# Patient Record
Sex: Female | Born: 1950 | Race: White | Hispanic: No | Marital: Married | Smoking: Never smoker
Health system: Southern US, Community
[De-identification: ages and names within clinical notes are randomized; demographics above are authoritative.]

## PROBLEM LIST (undated history)

## (undated) DIAGNOSIS — I10 Essential (primary) hypertension: Secondary | ICD-10-CM

## (undated) HISTORY — PX: BREAST SURGERY: SHX581

## (undated) HISTORY — PX: ABDOMINAL HYSTERECTOMY: SHX81

## (undated) HISTORY — PX: CSF SHUNT: SHX92

---

## 2014-02-11 NOTE — ED Notes (Signed)
Formatting of this note might be different from the original.  Patient removed from backboard at this time with provider at bedside.  Patient tolerated procedure well.  Electronically signed by Berna BueWright, Joseph R, RN at 02/11/2014  1:58 PM CST

## 2014-02-11 NOTE — ED Provider Notes (Signed)
Formatting of this note is different from the original.  Provider contact with the patient: 02/11/2014    15:42    Heather Waters [id number]    [facility]    History     Chief Complaint   Patient presents with   ? Fall     [facility], no [facility], no blood thinners. 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 C/o neck, back, L elbow, and flank pain. Hx L sided breast cancer.     Chief complaint narrative was entered by triage nurse, not by physician.    HPI Comments: 3:42 PM Heather Waters, a 63 y.o. female with a past medical history that includes--Negative on file--p[facility] no [facility]. Pt states she was not able to get up after the fall. She is now complaining of neck pain, head pain and pain on her left side (elbow, hips, flank).   Pt is not on blood thinners.     PCP: No primary care provider on file.     Fall  The history is provided by the patient. She came to the ER via EMS. Treatment on scene includes c-collar. The accident occurred 1 to 2 hours ago. The fall occurred walking.She fell from a height of 1 - 2 ft. She landed on a hard floor. The point of impact was the left side. The injury/pain location is left hip, left elbow, neck and head.The pain is at a severity of 9/10. The pain is severe. There was no loss of consciousness. She was not ambulatory at the scene. There was no entrapment after the fall. There was no drug use involved in the accident. There was no alcohol use involved in the accident. There has been headaches. There has been no fever, no nausea, no vomiting, no loss of consciousness and no shortness of breath.The symptoms are aggravated by movement and pressure on injury. She has tried nothing for the symptoms.     No past medical history on file.    No past surgical history on file.    No family history on file.    History     Social History   ? Marital  Status: Unknown     Spouse Name: N/A     Number of Children: N/A   ? Years of Education: N/A     Occupational History   ? Not on file.     Social History Main Topics   ? Smoking status: Not on file   ? Smokeless tobacco: Not on file   ? Alcohol Use: Not on file   ? Drug Use: Not on file   ? Sexual Activity: Not on file     Other Topics Concern   ? Not on file     Social History Narrative   ? No narrative on file     Review of Systems     Review of Systems   Constitutional: Negative.  Negative for fever, chills, weight loss, malaise/fatigue and diaphoresis.   HENT: Negative for sore throat.    Respiratory: Negative.  Negative for cough, hemoptysis, sputum production, shortness of breath and wheezing.    Cardiovascular: Negative.  Negative for chest pain, palpitations and leg swelling.   Gastrointestinal: Negative.  Negative for nausea, vomiting and diarrhea.   Genitourinary: Negative.    Musculoskeletal: Positive for joint pain (Left hip and elbow) and neck  pain.   Skin: Negative.  Negative for rash.   Neurological: Positive for headaches. Negative for dizziness, loss of consciousness and weakness.   Endo/Heme/Allergies: Does not bruise/bleed easily.   All other systems reviewed and are negative.    Physical Exam     BP 131/87 mmHg  Pulse 89  Temp(Src) 98.4 F  Resp 14  Ht 1.626 m (5\' 4" )  Wt 54.885 kg (121 lb)  BMI 20.76 kg/m2  SpO2 97%    Physical Exam   Constitutional: She is oriented to person, place, and time. She appears well-developed and well-nourished. Cervical collar in place.   HENT:   Head: Normocephalic and atraumatic.   Right Ear: External ear normal.   Left Ear: External ear normal.   Nose: Nose normal.   Eyes: Conjunctivae and EOM are normal. Pupils are equal, round, and reactive to light.   Neck: Normal range of motion.   Cardiovascular: Normal rate, regular rhythm, normal heart sounds and intact distal pulses.    Pulmonary/Chest: Effort normal and breath sounds normal. No respiratory  distress.   Abdominal: Soft. Bowel sounds are normal.   Musculoskeletal:        Left elbow: Tenderness found.        Cervical back: She exhibits tenderness (Midline).   Neurological: She is alert and oriented to person, place, and time.   Skin: Skin is warm and dry.   Psychiatric: She has a normal mood and affect. Her behavior is normal.   Nursing note and vitals reviewed.    Medications  Current Outpatient Prescriptions   Medication Sig Dispense Refill   ? oxyCODONE-acetaminophen (PERCOCET) 5-325 MG tablet Take 1 Tab by mouth every 6 hours as needed for Pain 30 Tab 0     Procedures   Procedures    ECG Interpretation  ECG Interpretation    Lab Interpretation            Oxygen Saturation Interpretation  The oxygen saturation level is: 94%. The patient was on Room Air for the saturation measurement. Measurement frequency: Spot Check. Oxygen saturation interpretation is Normal. Intervention(s) used: None.    No results found for this visit on 02/11/14.    XR ELBOW 2 VW LEFT   Final Result       Moderate joint effusion with an irregularity of the midportion of the   capitellum suggestive of an osteochondral fracture.     Mild scattered degenerative changes.     No other suspected recent fracture or other abnormality of the left   elbow identified.       CT CERVICAL SPINE NON  CONTRAST   Final Result       Portions of a ventriculoperitoneal shunt tubing in the right side of   the neck.     Suspected operative changes involving the posterior midline of the   foramen magnum and posterior ring of the C1 vertebra though spina   bifida occulta of the posterior midline of C1 may be a consideration.     Mild-to-moderate degenerative changes, most notably at C5-C6 and C6-C7,   with possible muscular spasm.     No other suspected recent fracture or other abnormality identified.         Progress Notes   Initial Plan: CT Cervical Spine Non Contrast, XR Elbow 2VW Left, Norco 5-325mg  tablet    5:50 PM Rechecked pt - Pt is resting.  Informed pt of results from X-Rays and plan to discharge home with a prescription for Percocet  5-325mg . Pt understands and agrees with the plan. All questions addressed.     Pt is medically stable for d/c home at this time.    I have given the patient instructions regarding her diagnosis, expectations, follow up, and return precautions. I explained to the patient that emergent conditions may arise and to return to the ER for new, worsening, or any persistent conditions. I've explained the importance of following up with Mitzi Hansen as instructed. The patient verbalized understanding of the discharge instructions.    ED Course   Medical Decision Making  I have reviewed the: Previous Chart, Nursing Notes and Vitals.  I have interpreted the following results: X-Ray, CT Scans and Oxygen Saturation.    Orders Placed This Encounter   ? CT CERVICAL SPINE NON  CONTRAST   ? XR ELBOW 2 VW LEFT   ? hydrocodone-acetaminophen (NORCO) 5-325 MG tablet 2 Tab   ? oxyCODONE-acetaminophen (PERCOCET) 5-325 MG tablet     Diagnosis:   Encounter Diagnoses   Name Primary?   ? Fall from slipping on slippery surface, initial encounter    ? Elbow fracture, left, closed, initial encounter Yes     New Medications:  Discharge Medication List as of 02/11/2014  5:50 PM     START taking these medications    Details   oxyCODONE-acetaminophen (PERCOCET) 5-325 MG tablet Disp-30 Tab, R-0, Take 1 Tab by mouth every 6 hours as needed for Pain, Print         I have advised the patient to follow-up with:  Phillip Heal., MD  [address]  [place] [address]  [phone]    Call in 1 day  to make appt with Orthopedic Surgeon for definitive care of the fracture    Disposition: Discharged    I have reviewed the information recorded by the scribe and agree with its accuracy and contents--Dr. Blane Ohara   02/11/2014 8:29 PM    Transcribed by Armen Pickup acting scribe on behalf of Dr. Blane Ohara   02/11/2014 5:50 PM  Electronically signed by Louanne Skye, MD at 02/11/2014  8:29 PM CST

## 2014-02-11 NOTE — ED Notes (Signed)
Formatting of this note might be different from the original.  Patient's CT scan resulted and C collar removed per Dr. Blane OharaMeara.  Patient has not had any decrease in left elbow pain after PO administration.  Provider notified and will continue to monitor.    Electronically signed by Berna BueWright, Joseph R, RN at 02/11/2014  4:58 PM CST

## 2014-02-11 NOTE — ED Notes (Signed)
Formatting of this note might be different from the original.  Awaiting provider to see patient. No specific needs from patient at this time.  Awaiting orders.  VSS.  Electronically signed by Berna BueWright, Joseph R, RN at 02/11/2014  3:15 PM CST

## 2014-02-11 NOTE — ED Notes (Signed)
Formatting of this note might be different from the original.  Bed: 23  Expected date:   Expected time:   Means of arrival:   Comments:  EIUTyrell Antonio- EARLENE [address]  Electronically signed by Bryson Damesrakas, Samantha L, RN at 02/11/2014  1:38 PM CST

## 2017-03-07 ENCOUNTER — Emergency Department (INDEPENDENT_AMBULATORY_CARE_PROVIDER_SITE_OTHER): Payer: Medicare Other

## 2017-03-07 ENCOUNTER — Emergency Department (HOSPITAL_BASED_OUTPATIENT_CLINIC_OR_DEPARTMENT_OTHER)
Admission: EM | Admit: 2017-03-07 | Discharge: 2017-03-07 | Disposition: A | Discharge status: Home / Self Care | Payer: Medicare Other | Attending: Emergency Medicine | Admitting: Emergency Medicine

## 2017-03-07 ENCOUNTER — Encounter (HOSPITAL_BASED_OUTPATIENT_CLINIC_OR_DEPARTMENT_OTHER): Payer: Self-pay | Admitting: Emergency Medicine

## 2017-03-07 ENCOUNTER — Emergency Department (HOSPITAL_BASED_OUTPATIENT_CLINIC_OR_DEPARTMENT_OTHER): Payer: Medicare Other

## 2017-03-07 ENCOUNTER — Other Ambulatory Visit: Payer: Self-pay

## 2017-03-07 ENCOUNTER — Emergency Department
Admission: EM | Admit: 2017-03-07 | Discharge: 2017-03-07 | Disposition: A | Discharge status: Home / Self Care | Payer: Medicare Other | Source: Home / Self Care | Attending: Family Medicine | Admitting: Family Medicine

## 2017-03-07 DIAGNOSIS — R112 Nausea with vomiting, unspecified: Secondary | ICD-10-CM | POA: Insufficient documentation

## 2017-03-07 DIAGNOSIS — M549 Dorsalgia, unspecified: Secondary | ICD-10-CM | POA: Diagnosis not present

## 2017-03-07 DIAGNOSIS — R0789 Other chest pain: Secondary | ICD-10-CM

## 2017-03-07 DIAGNOSIS — R1011 Right upper quadrant pain: Secondary | ICD-10-CM | POA: Insufficient documentation

## 2017-03-07 DIAGNOSIS — Z79899 Other long term (current) drug therapy: Secondary | ICD-10-CM | POA: Insufficient documentation

## 2017-03-07 DIAGNOSIS — R079 Chest pain, unspecified: Secondary | ICD-10-CM | POA: Diagnosis not present

## 2017-03-07 DIAGNOSIS — R197 Diarrhea, unspecified: Secondary | ICD-10-CM | POA: Insufficient documentation

## 2017-03-07 HISTORY — DX: Essential (primary) hypertension: I10

## 2017-03-07 LAB — CBC
HEMATOCRIT: 42.4 % (ref 36.0–46.0)
Hemoglobin: 14.1 g/dL (ref 12.0–15.0)
MCH: 30.3 pg (ref 26.0–34.0)
MCHC: 33.3 g/dL (ref 30.0–36.0)
MCV: 91.2 fL (ref 78.0–100.0)
PLATELETS: 236 10*3/uL (ref 150–400)
RBC: 4.65 MIL/uL (ref 3.87–5.11)
RDW: 12.7 % (ref 11.5–15.5)
WBC: 12 10*3/uL — AB (ref 4.0–10.5)

## 2017-03-07 LAB — POCT URINALYSIS DIP (MANUAL ENTRY)
GLUCOSE UA: NEGATIVE mg/dL
Ketones, POC UA: NEGATIVE mg/dL
Leukocytes, UA: NEGATIVE
NITRITE UA: NEGATIVE
PH UA: 5.5 (ref 5.0–8.0)
Protein Ur, POC: NEGATIVE mg/dL
RBC UA: NEGATIVE
UROBILINOGEN UA: 0.2 U/dL

## 2017-03-07 LAB — COMPREHENSIVE METABOLIC PANEL
ALT: 91 U/L — ABNORMAL HIGH (ref 14–54)
ANION GAP: 8 (ref 5–15)
AST: 181 U/L — AB (ref 15–41)
Albumin: 4.2 g/dL (ref 3.5–5.0)
Alkaline Phosphatase: 115 U/L (ref 38–126)
BILIRUBIN TOTAL: 1.4 mg/dL — AB (ref 0.3–1.2)
BUN: 12 mg/dL (ref 6–20)
CHLORIDE: 103 mmol/L (ref 101–111)
CO2: 28 mmol/L (ref 22–32)
Calcium: 9.4 mg/dL (ref 8.9–10.3)
Creatinine, Ser: 0.74 mg/dL (ref 0.44–1.00)
GFR calc Af Amer: >60 mL/min (ref >60)
Glucose, Bld: 118 mg/dL — ABNORMAL HIGH (ref 65–99)
POTASSIUM: 4.1 mmol/L (ref 3.5–5.1)
Sodium: 139 mmol/L (ref 135–145)
TOTAL PROTEIN: 7.9 g/dL (ref 6.5–8.1)

## 2017-03-07 LAB — D-DIMER, QUANTITATIVE (NOT AT ARMC): D DIMER QUANT: 0.56 ug{FEU}/mL — AB (ref 0.00–0.50)

## 2017-03-07 LAB — LIPASE, BLOOD: LIPASE: 26 U/L (ref 11–51)

## 2017-03-07 NOTE — ED Triage Notes (Signed)
Patient started to have pain in her right back and is coming around to the front. The patient states that she is having pain to her right upper quad region. The patient is also having N/V

## 2017-03-07 NOTE — Discharge Instructions (Signed)
Call if rash develops °

## 2017-03-07 NOTE — ED Notes (Signed)
Back to room.

## 2017-03-07 NOTE — ED Provider Notes (Signed)
[facility]    CSN: [id number] Arrival date & time: 03/07/17  1257     History   Chief Complaint Chief Complaint  Patient presents with  . Back Pain    HPI Sharon Carter is a 67 y.o. female.   Patient was awakened at 2:30am today by sharp pain in her right posterior chest that only lasted about 3 to 4 minutes.  The pain recurred about one hour ago, and lasted 2 to 3 minutes.  No recent cold or URI symptoms.  The pain Is not pleuritic.  She feels well otherwise.  No nausea/vomiting.  No urinary symptoms.  No fevers, chills, and sweats.  No rash. She has a past history of kidney stones, but notes that her present pain is different.  No changes in her urine.   The history is provided by the patient.    Past Medical History:  Diagnosis Date  . BP (high blood pressure)    in the past    There are no active problems to display for this patient.   Past Surgical History:  Procedure Laterality Date  . ABDOMINAL HYSTERECTOMY    . BREAST SURGERY     breast cancer  . CSF SHUNT     1962, 1986, 2016    OB History    No data available       Home Medications    Prior to Admission medications   Medication Sig Start Date End Date Taking? Authorizing Provider  amLODipine (NORVASC) 10 MG tablet Take 10 mg by mouth daily.   Yes [provider]  clonazePAM (KLONOPIN) 0.5 MG tablet Take 0.5 mg by mouth daily.   Yes [provider]  DULoxetine (CYMBALTA) 20 MG capsule Take 20 mg by mouth daily.   Yes [provider]  gabapentin (NEURONTIN) 100 MG capsule Take 100 mg by mouth 4 (four) times daily.   Yes [provider]  traMADol (ULTRAM) 50 MG tablet Take by mouth every 4 (four) hours as needed (up to 8 per day).   Yes [provider]    Family History Family History  Problem Relation Age of Onset  . Cancer Mother   . Diabetes Father   . Diabetes Brother     Social History Social History   Tobacco Use  . Smoking  status: Never Smoker  . Smokeless tobacco: Never Used  Substance Use Topics  . Alcohol use: Yes  . Drug use: No     Allergies   Patient has no known allergies.   Review of Systems Review of Systems  Constitutional: Negative for activity change, appetite change, chills, diaphoresis, fatigue and fever.  HENT: Negative.   Eyes: Negative.   Respiratory: Positive for chest tightness. Negative for cough, shortness of breath, wheezing and stridor.   Cardiovascular: Negative for palpitations and leg swelling.  Gastrointestinal: Negative.   Genitourinary: Negative.   Musculoskeletal: Negative.   Skin: Negative for rash.  Neurological: Positive for dizziness.     Physical Exam Triage Vital Signs ED Triage Vitals  Enc Vitals Group     BP 03/07/17 1326 123/83     Pulse Rate 03/07/17 1326 90     Resp --      Temp 03/07/17 1326 98 F (36.7 C)     Temp Source 03/07/17 1326 Oral     SpO2 03/07/17 1326 100 %     Weight 03/07/17 1327 101 lb (45.8 kg)     Height 03/07/17 1327 5\' 3"  (1.6 m)  Head Circumference --      Peak Flow --      Pain Score 03/07/17 1327 3     Pain Loc --      Pain Edu? --      Excl. in GC? --    No data found.  Updated Vital Signs BP 123/83 (BP Location: Right Arm)   Pulse 90   Temp 98 F (36.7 C) (Oral)   Ht 5\' 3"  (1.6 m)   Wt 101 lb (45.8 kg)   SpO2 100%   BMI 17.89 kg/m   Visual Acuity Right Eye Distance:   Left Eye Distance:   Bilateral Distance:    Right Eye Near:   Left Eye Near:    Bilateral Near:     Physical Exam  Constitutional: She appears well-developed and well-nourished. No distress.  HENT:  Head: Normocephalic.  Right Ear: External ear normal.  Left Ear: External ear normal.  Nose: Nose normal.  Mouth/Throat: Oropharynx is clear and moist.  Eyes: Conjunctivae are normal. Pupils are equal, round, and reactive to light.  Neck: Neck supple.  Cardiovascular: Normal heart sounds.  Pulmonary/Chest: Breath sounds normal.        Right posterior chest has vague tenderness to palpation  as noted on diagram.  No rash noted.  No flank or CVA tenderness.  Abdominal: Soft. Bowel sounds are normal. She exhibits no distension.  Lymphadenopathy:    She has no cervical adenopathy.  Neurological: She is alert.  Skin: Skin is warm. No rash noted.  Nursing note and vitals reviewed.    UC Treatments / Results  Labs (all labs ordered are listed, but only abnormal results are displayed) Labs Reviewed  POCT URINALYSIS DIP (MANUAL ENTRY) - Abnormal; Notable for the following components:      Result Value   Bilirubin, UA small (*)    Spec Grav, UA >=1.030 (*)    All other components within normal limits    EKG  EKG Interpretation None       Radiology Dg Chest 2 View  Result Date: 03/07/2017 CLINICAL DATA:  Right posterior chest pain. EXAM: CHEST  2 VIEW COMPARISON:  None. FINDINGS: Heart size and pulmonary vascularity are normal and the lungs are clear except for slight irregular apical pleural thickening on the left. No effusions. 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 Surgical clips in the left axilla. No acute bone abnormality. Aortic atherosclerosis. Benign circumscribed calcification in the upper left breast. IMPRESSION: No active cardiopulmonary disease. Electronically Signed   By: Francene Boyers M.D.   On: 03/07/2017 14:00    Procedures Procedures (including critical care time)  Medications Ordered in UC Medications - No data to display   Initial Impression / Assessment and Plan / UC Course  I have reviewed the triage vital signs and the nursing notes.  Pertinent labs & imaging results that were available during my care of the patient were reviewed by me and considered in my medical decision making (see chart for details).    Negative chest X-ray and urinalysis reassuring. No obvious cause of patient's symptoms. ?early shingles. Call if rash develops (would start  Valtrex).    Final Clinical Impressions(s) / UC Diagnoses   Final diagnoses:  Other chest pain    ED Discharge Orders    None           Lattie Haw, MD 03/07/17 1514

## 2017-03-07 NOTE — ED Provider Notes (Signed)
I have personally seen and examined the patient. I have reviewed the documentation on PMH/FH/Soc Hx. I have discussed the plan of care with the resident and patient.  I have reviewed and agree with the resident's documentation. Please see associated encounter note.  Briefly, the patient is a 67 y.o. female here with right mid back pain with radiation to the RUQ. Exam with TTP to right paraspinal muscle with spasms, mild RUQ pain w/o Murphy's sign. Labs with mild transaminitis. RUQ US with cholelithiasis but no evidence of acute cholecystitis or dilated common bile duct.  Pain at peers to be mostly muscular in nature.  Given her history of breast cancer, PE was also considered and dimer obtained which was negative when age-adjusted.  Chest x-ray and urinalysis obtained in urgent care were unremarkable.  The patient appears reasonably screened and/or stabilized for discharge and I doubt any other medical condition or other [facility] requiring further screening, evaluation, or treatment in the ED at this time prior to discharge.  The patient is safe for discharge with strict return precautions.    EKG Interpretation  Date/Time:  Monday March 07 2017 17:17:53 EST Ventricular Rate:  85 PR Interval:  138 QRS Duration: 78 QT Interval:  384 QTC Calculation: 456 R Axis:   -28 Text Interpretation:  Normal sinus rhythm Normal ECG Confirmed by Drema Pryardama, Farida Mcreynolds [id number]([id number]) on 03/07/2017 6:34:52 PM         Belenda Alviar, Amadeo GarnetPedro Eduardo, MD 03/08/17 0031

## 2017-03-07 NOTE — ED Triage Notes (Signed)
Pt started having upper back pain around 2:30 last night.  It has eased since, denies urinary sx.

## 2017-03-07 NOTE — ED Provider Notes (Signed)
[facility] EMERGENCY DEPARTMENT Provider Note   CSN: [id number] Arrival date & time: 03/07/17  1710   History   Chief Complaint Chief Complaint  Patient presents with  . Back Pain    HPI Sharon Carter is a 67 y.o. female with history of breast cancer in 5 years remission, also history of ventriculoperitoneal CSF shunt for hydrocephalus presenting with right upper back pain that has been intermittent starting at 2:30 AM.  Sharp pain that radiates from back to front. No dyspnea, not worse with deep breaths. She does endorse nausea, vomiting x1 in the ED, and diarrhea with watery stools x4-5 days. No fevers. No history of cholecystitis, no abdominal surgeries. She was seen earlier today in urgent care for similar symptoms.  Past Medical History:  Diagnosis Date  . BP (high blood pressure)    in the past    There are no active problems to display for this patient.   Past Surgical History:  Procedure Laterality Date  . ABDOMINAL HYSTERECTOMY    . BREAST SURGERY     breast cancer  . CSF SHUNT     1962, 1986, 2016    OB History    No data available      Home Medications    Prior to Admission medications   Medication Sig Start Date End Date Taking? Authorizing Provider  amLODipine (NORVASC) 10 MG tablet Take 10 mg by mouth daily.    [provider]  clonazePAM (KLONOPIN) 0.5 MG tablet Take 0.5 mg by mouth daily.    [provider]  DULoxetine (CYMBALTA) 20 MG capsule Take 20 mg by mouth daily.    [provider]  gabapentin (NEURONTIN) 100 MG capsule Take 100 mg by mouth 4 (four) times daily.    [provider]  traMADol (ULTRAM) 50 MG tablet Take by mouth every 4 (four) hours as needed (up to 8 per day).    [provider]   Family History Family History  Problem Relation Age of Onset  . Cancer Mother   . Diabetes Father   . Diabetes Brother     Social History Social History   Tobacco Use  . Smoking status:  Never Smoker  . Smokeless tobacco: Never Used  Substance Use Topics  . Alcohol use: Yes  . Drug use: No     Allergies   Patient has no known allergies.  Review of Systems Review of Systems  Constitutional: Negative for fever.  HENT: Negative for congestion, rhinorrhea and sore throat.   Respiratory: Negative for cough, chest tightness, shortness of breath and wheezing.   Cardiovascular: Negative for chest pain.  Gastrointestinal: Negative for abdominal distention.  Skin: Negative for rash.    Physical Exam Updated Vital Signs BP 112/76 (BP Location: Right Arm)   Pulse 82   Temp (!) 97.5 F (36.4 C) (Oral)   Resp 18   Ht 5\' 3"  (1.6 m)   Wt 45.8 kg (101 lb)   SpO2 98%   BMI 17.89 kg/m   Physical Exam  Constitutional: She is oriented to person, place, and time. She appears well-developed and well-nourished.  Eyes: Conjunctivae are normal.  Neck: Neck supple.  Cardiovascular: Normal rate, regular rhythm and normal heart sounds. Exam reveals no friction rub.  No murmur heard. Pulmonary/Chest: Effort normal and breath sounds normal. No respiratory distress. She has no wheezes. She has no rales.  Abdominal: Soft.  Tenderness to palpation in RUQ without rebound or guarding.  Genitourinary:  Genitourinary Comments:  No CVA tenderness  Musculoskeletal:  +tenderness to palpation over right upper back, no midline tenderness or ttp over spinous process.   Neurological: She is alert and oriented to person, place, and time.  Skin: Skin is warm and dry. No rash noted.    ED Treatments / Results  Labs (all labs ordered are listed, but only abnormal results are displayed) Labs Reviewed  D-DIMER, QUANTITATIVE (NOT AT [facility]) - Abnormal; Notable for the following components:      Result Value   D-Dimer, Quant 0.56 (*)    All other components within normal limits  CBC - Abnormal; Notable for the following components:   WBC 12.0 (*)    All other components within normal limits    COMPREHENSIVE METABOLIC PANEL - Abnormal; Notable for the following components:   Glucose, Bld 118 (*)    AST 181 (*)    ALT 91 (*)    Total Bilirubin 1.4 (*)    All other components within normal limits  LIPASE, BLOOD    EKG  EKG Interpretation  Date/Time:  Monday March 07 2017 17:17:53 EST Ventricular Rate:  85 PR Interval:  138 QRS Duration: 78 QT Interval:  384 QTC Calculation: 456 R Axis:   -28 Text Interpretation:  Normal sinus rhythm Normal ECG Confirmed by Drema Pry [phone]) on 03/07/2017 6:34:52 PM      Radiology Dg Chest 2 View  Result Date: 03/07/2017 CLINICAL DATA:  Right posterior chest pain. EXAM: CHEST  2 VIEW COMPARISON:  None. FINDINGS: Heart size and pulmonary vascularity are normal and the lungs are clear except for slight irregular apical pleural thickening on the left. No effusions. 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 Surgical clips in the left axilla. No acute bone abnormality. Aortic atherosclerosis. Benign circumscribed calcification in the upper left breast. IMPRESSION: No active cardiopulmonary disease. Electronically Signed   By: Francene Boyers M.D.   On: 03/07/2017 14:00   US Abdomen Limited Ruq  Result Date: 03/07/2017 CLINICAL DATA:  67 year old female with right upper quadrant abdominal pain. EXAM: ULTRASOUND ABDOMEN LIMITED RIGHT UPPER QUADRANT COMPARISON:  None. FINDINGS: Gallbladder: The gallbladder is physiologically distended. Multiple small stones noted in the gallbladder there is no gallbladder wall thickening or pericholecystic fluid. Negative sonographic Murphy's sign. Common bile duct: Diameter: 6 mm Liver: The liver is grossly unremarkable. Portal vein is patent on color Doppler imaging with normal direction of blood flow towards the liver. IMPRESSION: 1. Cholelithiasis without sonographic evidence of acute cholecystitis. 2. Patent main portal vein with hepatopetal flow. Electronically Signed   By: Elgie Collard M.D.   On: 03/07/2017 21:20    Procedures Procedures (including critical care time)  Medications Ordered in ED Medications - No data to display   Initial Impression / Assessment and Plan / ED Course  I have reviewed the triage vital signs and the nursing notes.  Pertinent labs & imaging results that were available during my care of the patient were reviewed by me and considered in my medical decision making (see chart for details).   66yoF with history of breast cancer 5 years in remission who presents with right upper back pain radiating to her RUQ. Uncertain etiology; considered cholecystitis, however U/S of her GB showed no wall thickening or pericholecystic fluid. Lipase was normal. Considered PE given history of malignancy, however Ddimer was WNL when adjusted for age.  UA and CXR from earlier today at urgent care were also unremarkable.  Patient remained stable and well-appearing while monitored in  the ED. She was considered stable for discharge.   Final Clinical Impressions(s) / ED Diagnoses   Final diagnoses:  RUQ pain  Acute right-sided back pain, unspecified back location   ED Discharge Orders    None       Howard Pouch, MD 03/07/17 2224

## 2017-03-07 NOTE — Discharge Instructions (Signed)
You were seen in the Emergency Department for back pain and abdominal pain. This may be caused by muscle strain. Your ultrasound of your gall bladder was negative.  Please follow up with your regular doctor. If you develop shortness of breath or pain with breathing, these would be reasons to seek care sooner.

## 2017-03-07 NOTE — ED Notes (Signed)
Patient transported to Ultrasound 

## 2017-04-17 ENCOUNTER — Emergency Department (INDEPENDENT_AMBULATORY_CARE_PROVIDER_SITE_OTHER): Payer: Medicare Other

## 2017-04-17 ENCOUNTER — Emergency Department (INDEPENDENT_AMBULATORY_CARE_PROVIDER_SITE_OTHER)
Admission: EM | Admit: 2017-04-17 | Discharge: 2017-04-17 | Disposition: A | Discharge status: Home / Self Care | Payer: Medicare Other | Source: Home / Self Care

## 2017-04-17 ENCOUNTER — Encounter: Payer: Self-pay | Admitting: Emergency Medicine

## 2017-04-17 DIAGNOSIS — M25511 Pain in right shoulder: Secondary | ICD-10-CM | POA: Diagnosis not present

## 2017-04-17 DIAGNOSIS — S46911A Strain of unspecified muscle, fascia and tendon at shoulder and upper arm level, right arm, initial encounter: Secondary | ICD-10-CM

## 2017-04-17 MED ORDER — METHOCARBAMOL 500 MG PO TABS: 500.0000 mg | ORAL_TABLET | Freq: Two times a day (BID) | ORAL | 0 refills | Status: AC

## 2017-04-17 MED ORDER — DICLOFENAC SODIUM 50 MG PO TBEC: 50.0000 mg | DELAYED_RELEASE_TABLET | Freq: Two times a day (BID) | ORAL | 0 refills | Status: AC

## 2017-04-17 NOTE — ED Triage Notes (Signed)
Formatting of this note might be different from the original.  Patient complaining of complaining of right shoulder pain for several weeks, patient fell about 3 weeks ago  Electronically signed by Ivan Croft, CMA at 04/17/2017  2:56 PM EST
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
## 2017-04-17 NOTE — Discharge Instructions (Signed)
Return if any problems.
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
## 2017-04-17 NOTE — ED Triage Notes (Signed)
Patient complaining of complaining of right shoulder pain for several weeks, patient fell about 3 weeks ago

## 2019-08-06 IMAGING — DX DG CHEST 2V
2 series · 2 of 2 positions shown · non-contrast
Comparison: None.

CLINICAL DATA: Right posterior chest pain.

EXAM:
CHEST  2 VIEW

[chest pa]
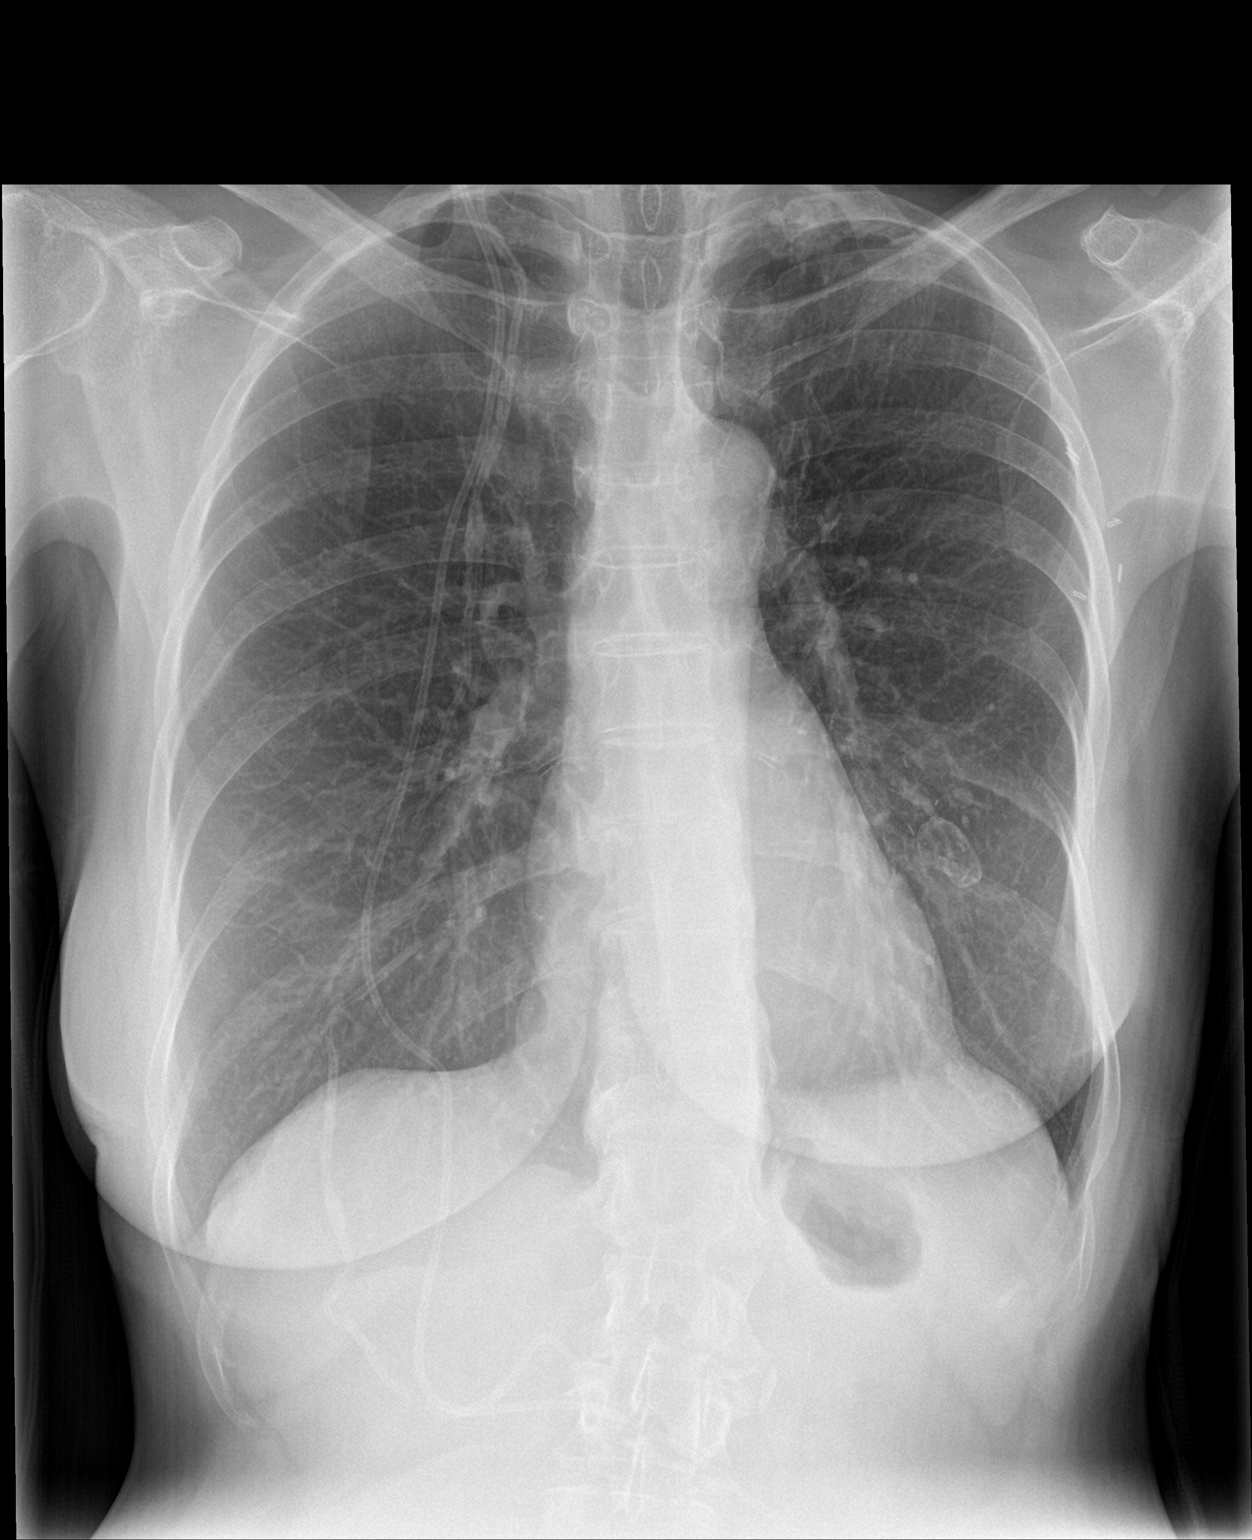

[chest lat]
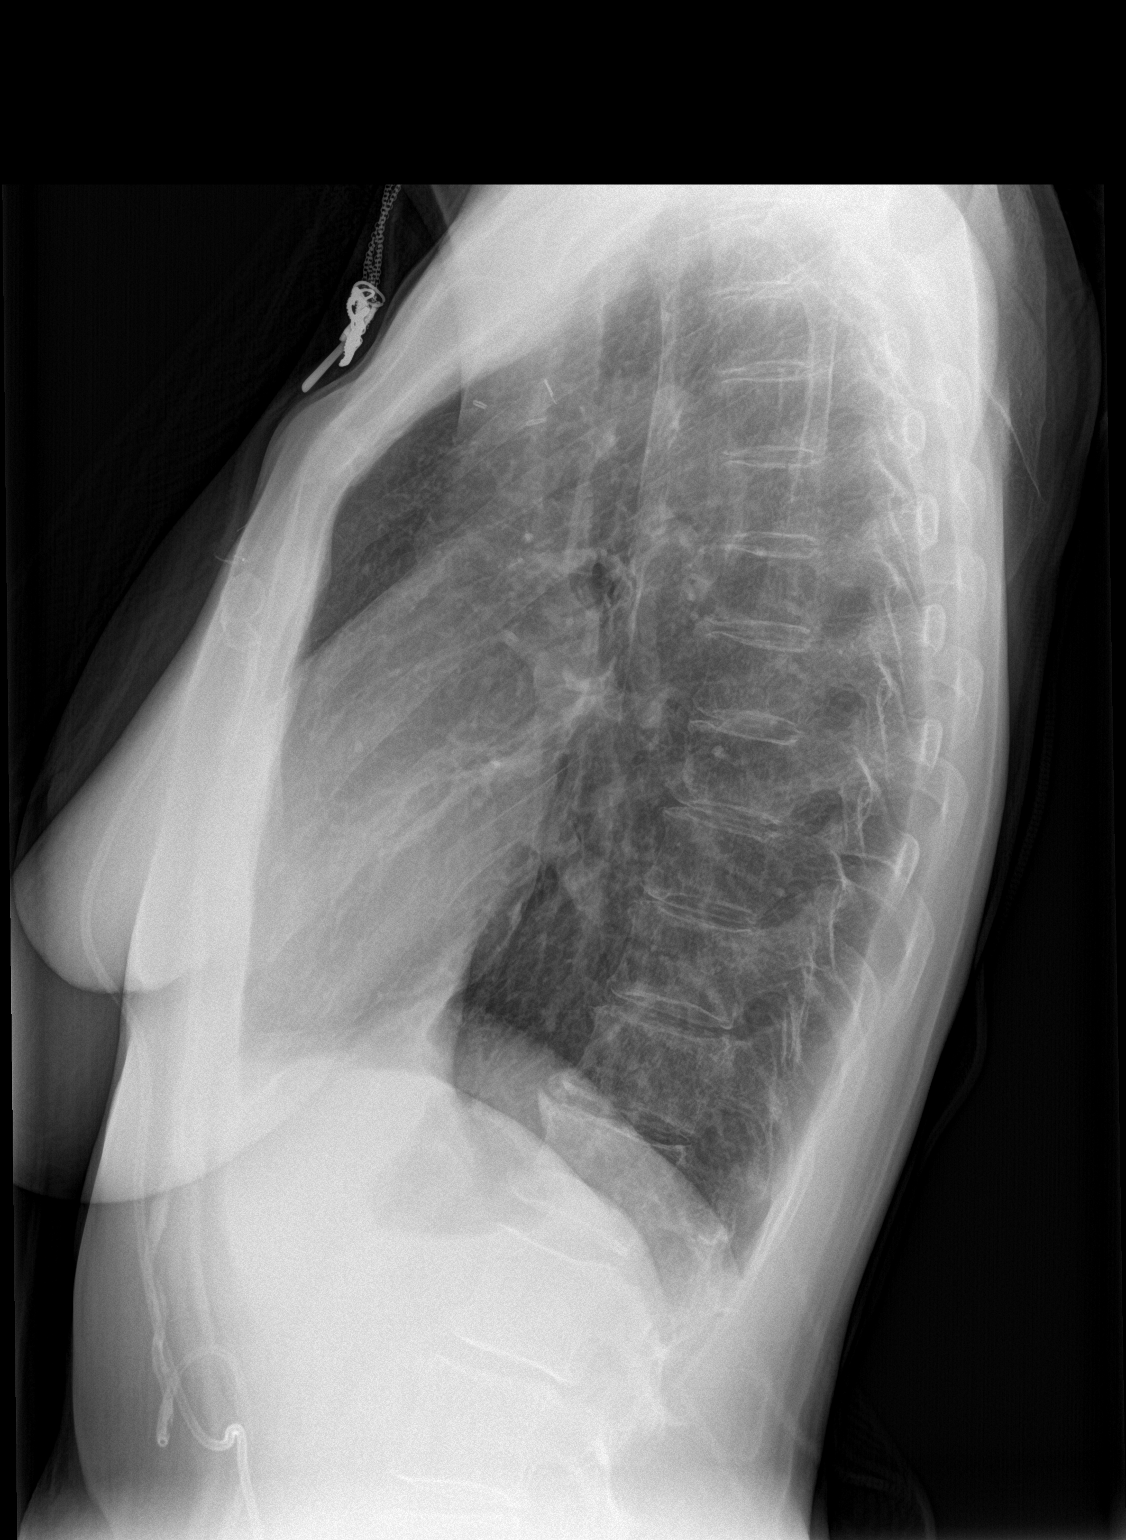

[2 of 2 positions shown; findings below may reference images not displayed]

FINDINGS: Heart size and pulmonary vascularity are normal and the lungs are
clear except for slight irregular apical pleural thickening on the
left. No effusions.

Ventriculoperitoneal shunt tubes overlie the right hemithorax.
Surgical clips in the left axilla. No acute bone abnormality.

Aortic atherosclerosis.

Benign circumscribed calcification in the upper left breast.
IMPRESSION: No active cardiopulmonary disease.

## 2019-09-16 IMAGING — DX DG SHOULDER 2+V*R*
3 series · 3 of 3 positions shown · non-contrast
Comparison: 03/07/2017 chest radiograph

CLINICAL DATA: Acute RIGHT shoulder pain following fall 3 weeks
ago. Initial encounter.

EXAM:
RIGHT SHOULDER - 2+ VIEW

[shoulder grashey]
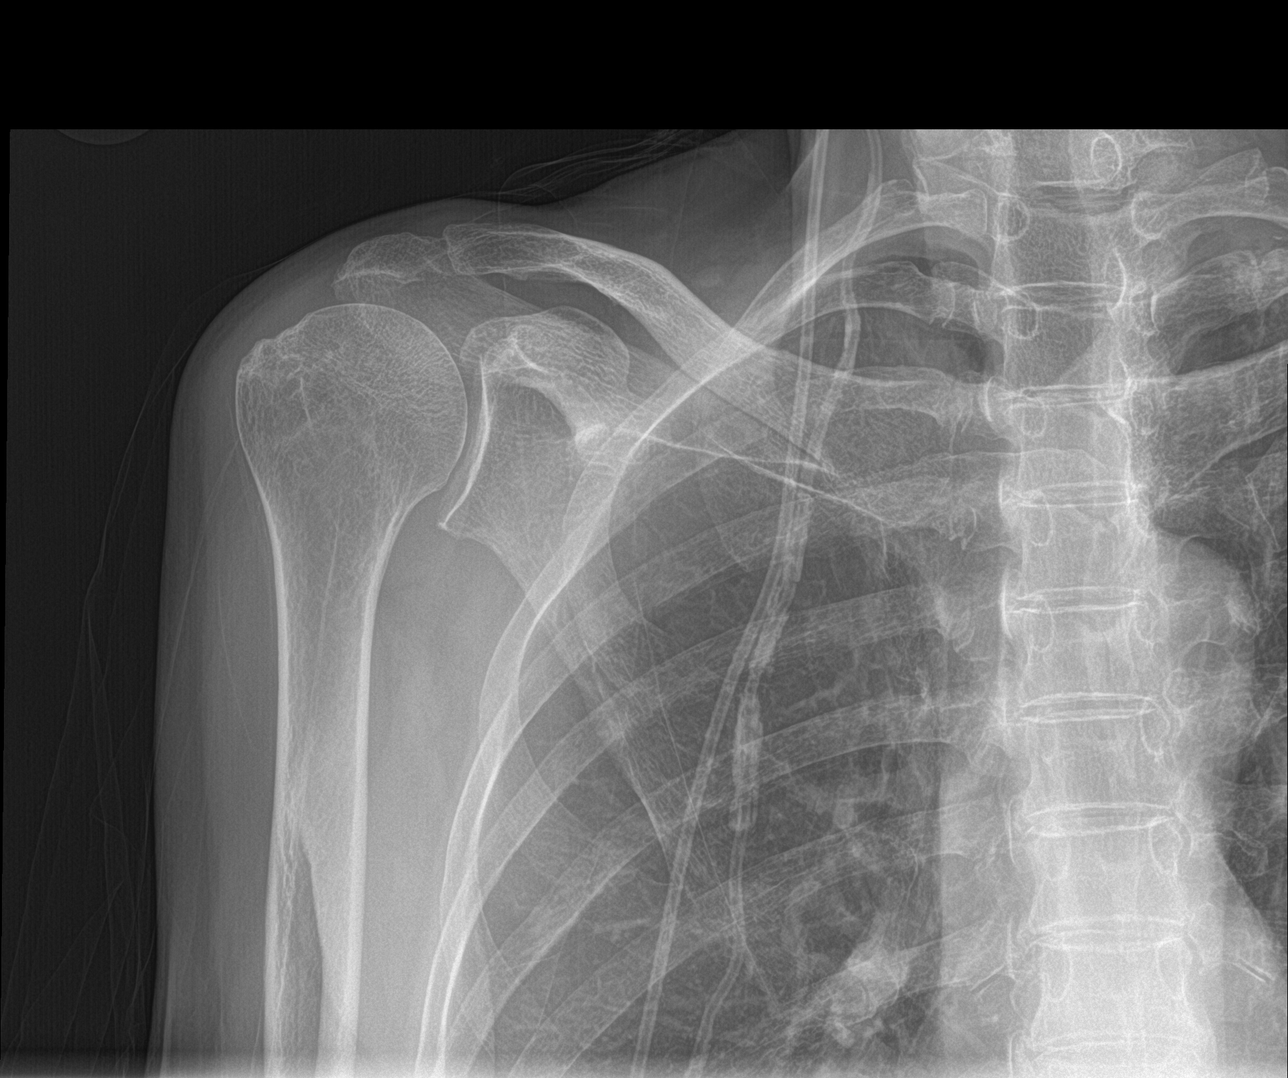

[shoulder y view]
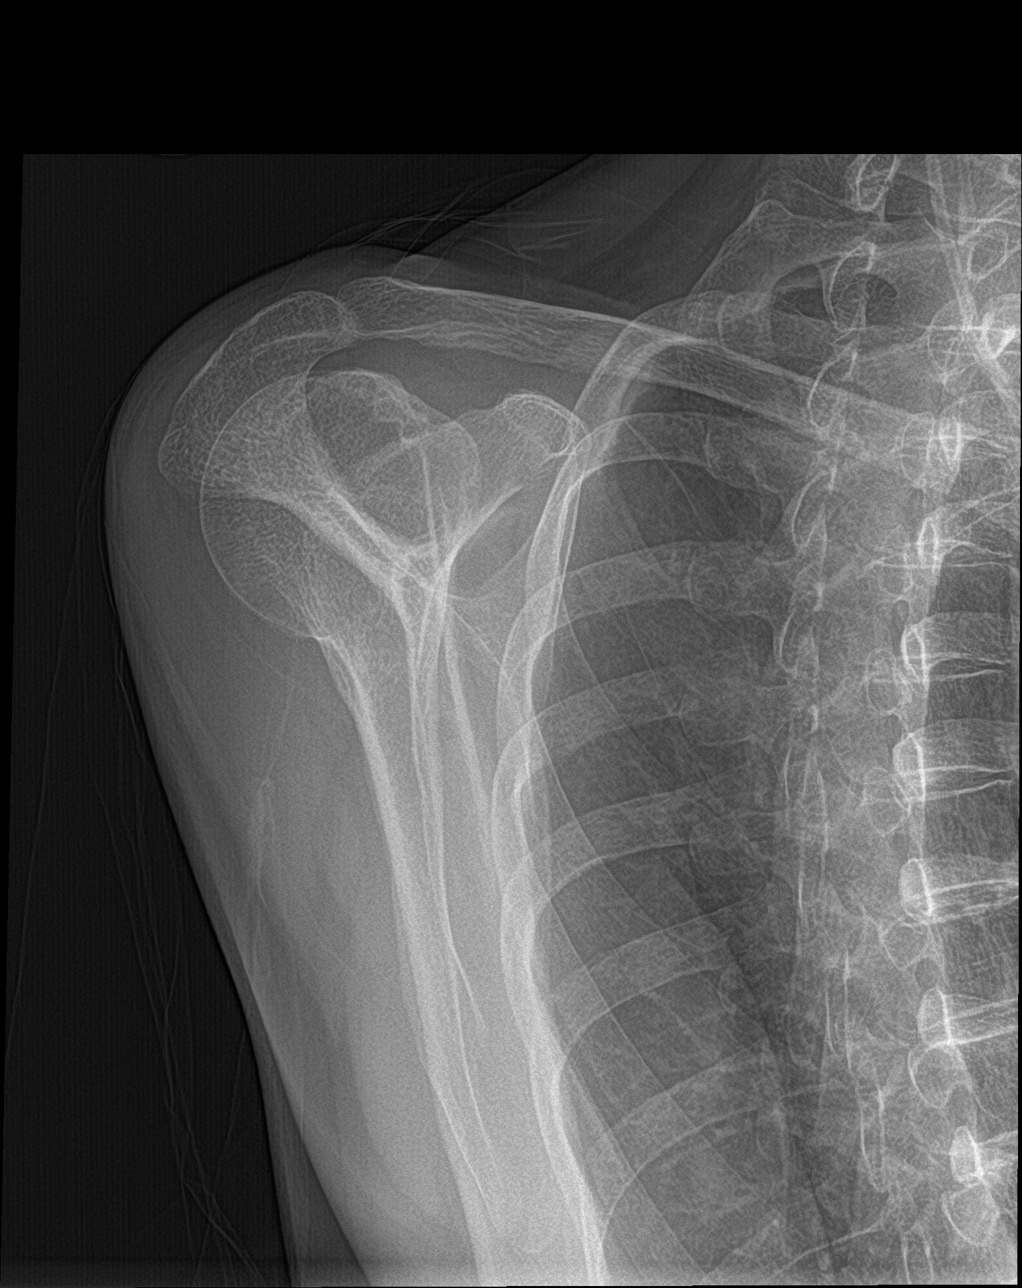

[shoulder axillary]
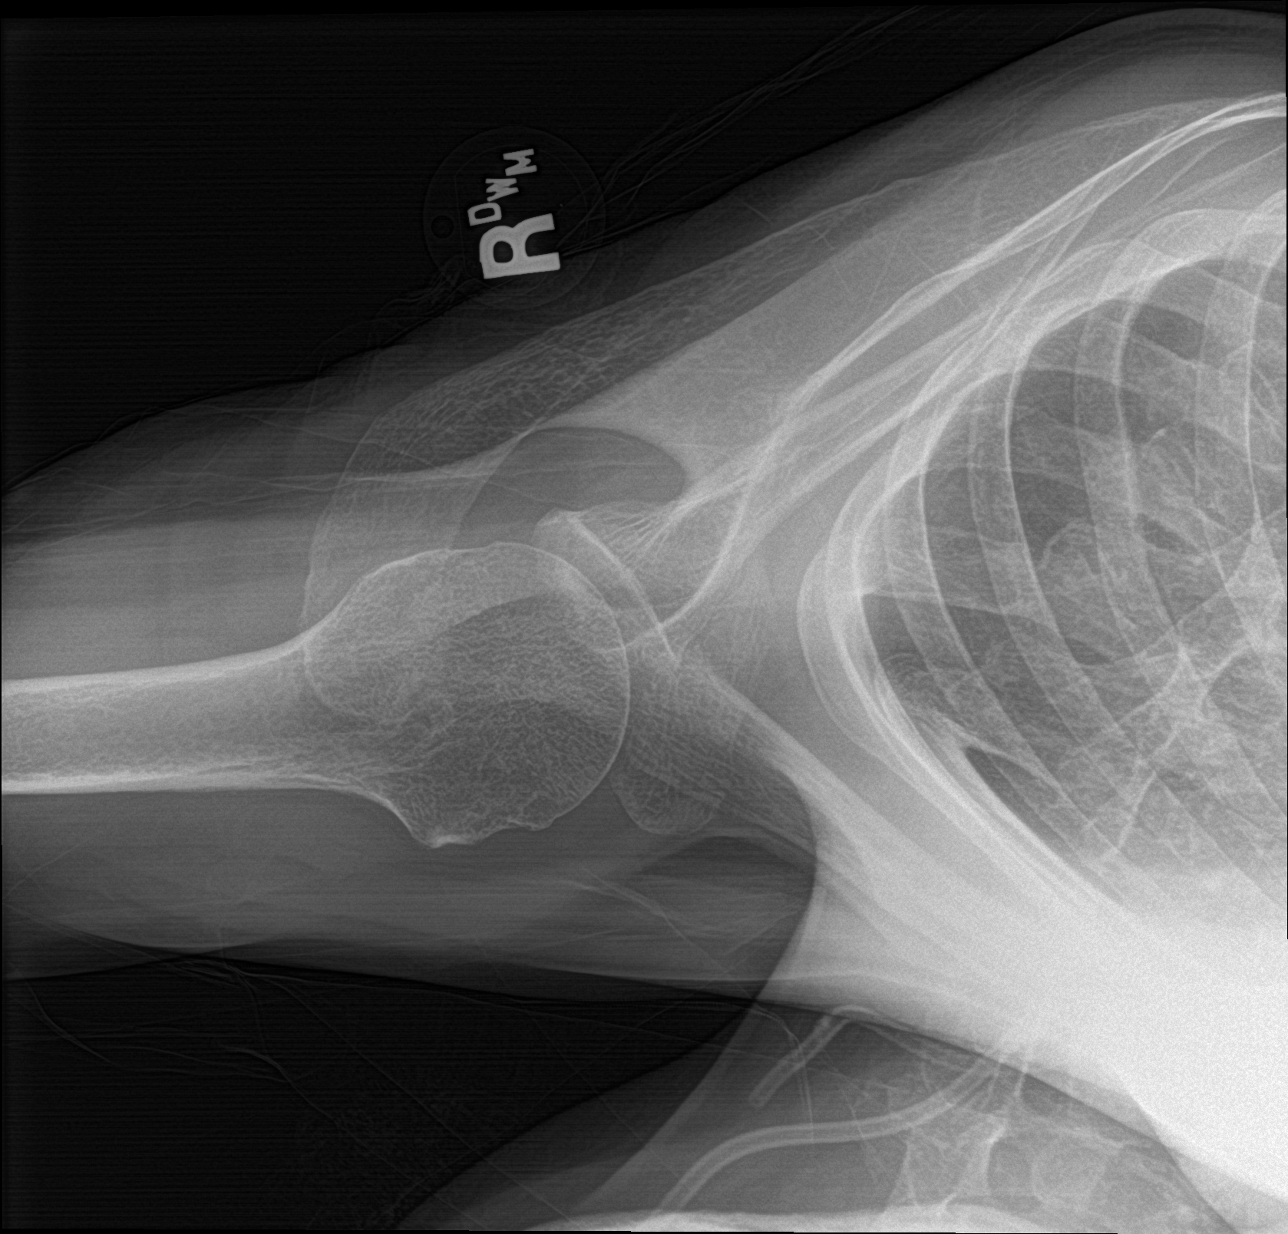

[3 of 3 positions shown; findings below may reference images not displayed]

FINDINGS: There is no evidence of fracture or dislocation. There is no
evidence of arthropathy or other focal bone abnormality. Soft
tissues are unremarkable.
IMPRESSION: Negative.
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
## 2021-10-24 NOTE — ED Provider Notes (Signed)
Associated Order(s): Critical Care  Formatting of this note is different from the original.  MEDICAL DECISION MAKING     Complexity of Primary Problem Addressed:  <<Acute injury that poses a threat to life or bodily function>>    Disposition Decision:  ADMIT TO SURGERY with neurosurgery consultation ([facility])    Problems Addressed:  1. Subdural hemorrhage (CMS/HCC)    2. Fall, initial encounter    3. VP (ventriculoperitoneal) shunt status    4. 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 Leda Gauze, Dr Hinda Kehr    Pertinent ED Course, Orders, and Results Reviewed:  ED Course as of 10/24/21 2009   Sat Oct 24, 2021   1648 Pt reports last Tdap was 3 years ago- she is up to date.   [SS]   [place] of radiology contacted:   "left tentorium and posterior falx SDH" [SS]   6270 Dr Ronita Hipps of neurosurgery paged. [SS]   1739 Trauma 1st call paged, DR Hinda Kehr.  Will see for admission [SS]   1803 CT CERVICAL SPINE WITHOUT CONTRAST [SS]   1803 XR SHOULDER MINIMAL 2 VW RIGHT [SS]   1803 CT HEAD WITHOUT CONTRAST [SS]   1803 XR CHEST 1 VW [SS]   1803 CT HEAD WITHOUT CONTRAST [SS]   1902 Comprehensive metabolic panel(!):    Sodium 143   Potassium 3.9   Chloride 107   CO2 25.5   Anion Gap 11   Osmolality Calc 294.93   BUN 11   Creatinine, Ser 0.66   Bun/Creatinine Ratio 16.67   Glucose, Fasting 90   Calcium 9.3   Corrected  Calcium 9.3   Total Protein 7.1   Albumin 4.0   Globulin, Total 3.10   ALT (SGPT) 6(!)   AST 15   Alkaline Phosphatase 98   Total Bilirubin 0.4   A/G Ratio 1   eGFR 93.9 [SS]   1902 Ethanol:    Ethanol <10 [SS]   1902 Lipase:    Lipase 17 [SS]   1923 CBC auto differential:    White Blood Cell(WBC)Count 9.6   Red Blood Cell (RBC) Count 4.29   Hemoglobin 13.1   HEMATOCRIT 38.9   MCV 90.7   MCH 30.5   MCHC 33.6   RDW 14.0   Platelets 213   Neutrophils Relative 65.8   Lymphocytes Relative 24.3   Monocytes Relative 7.7   Eosinophils Relative 1.4   Basophils Relative 0.9   Neutrophils Absolute 6.3   Lymphocytes Absolute 2.3   Monocytes Absolute 0.7   Eosinophils Absolute 0.1   Baso (Absolute) 0.1   Nucleated RBC Absolute 0.0   nRBC 0.0 [SS]   2009 Thromboelastograph (TEG)(!):    Alpha Angle 73.1(!)   Coagulation Index 3.3(!)   Heparinized Sample No   Kinetics 1.1   Maximum Amplitude 72.4(!)   Percent Lysis At 30 Minutes  0.0   Rate 4.4(!) [SS]   2009 Type and screen:    ABO RH O POS   Antibody Screen NEG   Crossmatch Status NTYPE   Specimen Expiration Date 10/27/21 [SS]   2009 Prothrombin Time-INR(!):    PT 12.3(!)   INR 0.9(!) [SS]     ED Course User Index  [SS] Milana Huntsman, MD     Orders Placed This Encounter   Procedures    Critical Care    CT HEAD WITHOUT CONTRAST    CT CERVICAL SPINE WITHOUT CONTRAST    XR CHEST 1 VW    XR SHOULDER MINIMAL 2 VW RIGHT    CT HEAD WITHOUT CONTRAST    CBC auto differential    Comprehensive metabolic panel    Lipase    Urinalysis with microscopic    Lactic Acid Reflex    Prothrombin Time-INR    PTT (aPTT)    Type and screen    Thromboelastograph (TEG)    Ethanol    Drug screen, urine    Extra Tubes    Extra Tube Gold    ABORH2    NPO Diet:    Log roll    In and out catheter for spec collection    Cardiac monitoring    Category 1 - Full Code    Inpatient consult to Neurosurgery    Inpatient consult to Trauma Surgery    Pulse oximetry, continuous    Insert and maintain  peripheral IV    Admit to Inpatient    ED IP Bed Request     All ED ORDERS INDEPENDENTLY REVIEWED BY DR [place]- SEE ED COURSE ABOVE FOR PERTINENT RESULTS    ED Medications Administered:  Medications   clonazePAM (use for KlonoPIN) tablet 1 mg (has no administration in time range)   gabapentin (use for NEURONTIN) capsule 300 mg (has no administration in time range)   pantoprazole (use for PROTONIX) EC tablet 20 mg (has no administration in time range)   DULoxetine (use for CYMBALTA) DR capsule 60 mg (has no administration in time range)   sodium chloride 0.9 % bolus premix 1,000 mL (0 mL Intravenous Stopped 10/24/21 2008)   levETIRAcetam (use for KEPPRA) injection 1,000 mg (1,000 mg Intravenous Given 10/24/21 1800)   fentaNYL (PF) (use for SUBLIMAZE) injection 50 mcg (50 mcg Intravenous Given 10/24/21 1802)   ondansetron (PF) (use for ZOFRAN) injection 4 mg (4 mg Intravenous Given 10/24/21 1759)     All ED MEDICATIONS ADMINISTERED REQUIRED MONITORING AND RE-EVALUATION FOR TOXICITY, SIDE EFFECTS, AND CHANGE IN VITAL SIGNS    Independent Interpretation of Tests (only checked boxes) (also see ED course above):  _0   EKG:   personally visualized, see my read in ED course above; also official read in [facility]  _1   CXR:   lungs clear  _2   Head CT: Posterior left subdural hemorrhage   _3   CTPE:  _4   CT C-spine:  no acute findings  _5   CT Chest:   _6   CT Abdomen/Pelvis:    _7   Ultrasound:  _8   ABG:    Diagnostic Tests or Therapies Considered But Not Ordered:    Important Co-Morbidities Increasing Patient's Risk:  >  > has no past medical history on file.  >No past surgical history on file.    Social Determinants of Health:  >  >  Social History     Tobacco Use    Smoking status: Not on file    Smokeless tobacco: Not  on file   Substance Use Topics    Alcohol use: Not on file     >family history is not on file.     All external notes if available were reviewed including EMS records, nursing records, most recent ED visit, most  recent hospital admission, transferring hospital records, transferring medical facility records, prior visits in our hospital system, prior visits in [place] (outside hospital systems) and [facility] (if completed):                                  Glasgow Coma Scale  Eye Opening: Spontaneous  Best Verbal Response: Oriented  Best Motor Response: Obeys commands  Glasgow Coma Scale Score: 15          RISK   Discussion and decision-making with patient and available parties regarding need for admission rather than outpatient management with risks and benefits involved.    PROCEDURES     Critical Care    Performed by: Milana Huntsman, MD  Authorized by: Milana Huntsman, MD    Critical care provider statement:     Critical care time (minutes):  32    Critical care was necessary to treat or prevent imminent or life-threatening deterioration of the following conditions:  CNS failure or compromise    Critical care was time spent personally by me on the following activities:  Discussions with consultants, development of treatment plan with patient or surrogate, ordering and review of radiographic studies, ordering and review of laboratory studies, ordering and performing treatments and interventions, re-evaluation of patient's condition, review of old charts, evaluation of patient's response to treatment, examination of patient and obtaining history from patient or surrogate  Comments:      Fall/trauma/SDH    HISTORY OF [place]     ED Provider First Contact Date and Time: 10/24/2021  4:39 PM  ED Triage Vitals   Temp Pulse Resp BP SpO2   10/24/21 1609 10/24/21 1604 10/24/21 1604 10/24/21 1604 10/24/21 1611   98 F (36.7 C) 92 17 (!) 146/93 98 %     Temp Source Heart Rate Source Patient Position BP Cuff Site Location FiO2 (%)   10/24/21 1609 10/24/21 1604 -- -- --   Oral Monitor        Chief  Complaint:   Chief Complaint   Patient presents with    Fall     HPI: Heather Waters is a 71 y.o. female with no PMHx on file who presents to the ED via EMS with complaints of a mechanical fall with head injury just prior to ED arrival. Pt raises a concern due to a swollen mass appearing in her occipital region in which pt has a shunt placed for hydrocephalous. Pt denies LOC, but doesn't recall how the fall occurred. She notes having Left shoulder pain, but not more than the usual due to having a prior rotator cuff injury. Pt is visiting family from [place], [place]. Pt states having a shunt readjustment 2 years ago, and has a PCP in [place], [place]. Pt denies anticoagulants. Her last tetanus shot was 3 years ago. There are no other complaints or symptoms verbalized at this time.     PCP:  No primary care provider on file.  Consultants:      PERTINENT NEGATIVE REVIEW OF SYSTEMS     General: no  fever, no chills, no change in weight, no change in mental status  C/V: no cp, no palpitations  Resp: no sob, no cough, no congestion  GI: no vomiting, no diarrhea, no constipation, no abdominal pain  G/U: no dysuria  Skin: no rashes or skin changes  MSK: no joint or extremity pain, no back pain    CURRENT MEDICATIONS AND ALLERGIES     Medications:  The patient?s home medications section has been reviewed.    Current Facility-Administered Medications:     clonazePAM (use for KlonoPIN) tablet 1 mg, 1 mg, Oral, Nightly, [place], DO    [START ON 10/25/2021] DULoxetine (use for CYMBALTA) DR capsule 60 mg, 60 mg, Oral, Daily, [employer], DO    gabapentin (use for NEURONTIN) capsule 300 mg, 300 mg, Oral, 4x Daily, [place], DO    [START ON 10/25/2021] pantoprazole (use for PROTONIX) EC tablet 20 mg, 20 mg, Oral, Daily, Vassie Moment, DO    Current Outpatient Medications:     amLODIPine (use for NORVASC) 5 mg tablet, Take 5 mg by mouth in the morning., Disp: , Rfl:     clonazePAM (use for KlonoPIN) 1 mg  tablet, Take 1 mg by mouth nightly, Disp: , Rfl:     diclofenac (use for VOLTAREN) 50 mg EC tablet, Take 50 mg by mouth in the morning and 50 mg in the evening., Disp: , Rfl:     DULoxetine (use for CYMBALTA) 60 mg capsule, Take 60 mg by mouth in the morning., Disp: , Rfl:     gabapentin (use for NEURONTIN) 300 mg capsule, Take 300 mg by mouth in the morning and 300 mg at noon and 300 mg in the evening and 300 mg before bedtime., Disp: , Rfl:     omeprazole (PriLOSEC) 40 mg capsule, Take 40 mg by mouth in the morning., Disp: , Rfl:     Allergies: is allergic to walnut.    PHYSICAL EXAM     Vital Signs: Reviewed the patient?s updated vital signs throughout stay.   Blood pressure (!) 134/98, pulse 87, temperature 98 F (36.7 C), temperature source Oral, resp. rate 17, SpO2 99 %.      Constitutional: nontoxic, no distress noted  Head: 6cm right occipital hematoma with overlying abrasion with no laceration and no bleeding.  No wounds noted.  Eyes: PERRL, EOMI. Conjunctivae are not pale.   ENT: Mucous membranes are dry.  Neck: Supple. Full ROM. No lymphadenopathy.  No midline pain.  No meningismus.  Cardiovascular: Regular rate. Regular rhythm. Distal pulses are 2+ and symmetric.   Pulmonary: No evidence of respiratory distress. Clear to auscultation bilaterally.   Chest Wall: No tenderness to palpation.   Abdominal: Soft and non-distended. There is no tenderness. No rebound, guarding, or rigidity.   Back: No CVA tenderness. No midline pain.  Extremities: No edema. Full ROM in all extremities. No calf tenderness. Diffuse right shoulder pain with no deformity.   Skin: Skin is warm and dry. No visible rashes present.   Neurological: Alert, awake, and oriented to person, place, time, and situation. Normal speech.   Psychiatric: Good eye contact. Normal interaction, affect, and behavior. No SI/HI/psychosis.    RESULTS     Labs Reviewed   COMPREHENSIVE METABOLIC PANEL - Abnormal       Result Value    Sodium 143       Potassium 3.9      Chloride 107      Carbon Dioxide 25.5  Anion Gap 11      Osmolality Calculation 294.93      Urea Nitrogen 11      Creatinine 0.66      BUN/Creat Ratio 16.67      Glucose 90      Calcium 9.3      Corrected Calcium 9.3      Total Protein 7.1      Albumin 4.0      Globulin 3.10      Alanine Transferase 6 (*)     Aspartamine Transferase 15      Alkaline Phosphatase 98      Total Bilirubin 0.4      Albumin/Globulin Ratio 1      eGFR 93.9      Modified Cockcroft-Gault CrCl       PROTHROMBIN TIME-INR - Abnormal    Prothrombin Time 12.3 (*)     INR 0.9 (*)     Narrative:     Note that the listed reference range for INR is based on the presumed target for therapeutic anticoagulation. The listed reference range for the PT is based on a normal population of non-anticoagulated healthy subjects.   THROMBOELASTOGRAPH (TEG) - Abnormal    Alpha Angle 73.1 (*)     Coagulation Index 3.3 (*)     Heparinized Specimen No      Kinetics 1.1      Maximum Amplitude 72.4 (*)     Percent Lysis at 30 Minutes 0.0      Rate 4.4 (*)    CBC AUTO DIFFERENTIAL - Normal    WBC 9.6      RBC 4.29      HGB 13.1      HCT 38.9      MCV 90.7      MCH 30.5      MCHC 33.6      RDW 14.0      Platelets 213      Auto Neutrophil Percent 65.8      Auto Lymphocyte Percent 24.3      Auto Monocyte Percent 7.7      Auto Eosinophil Percent 1.4      Auto Basophil  0.9      Auto Absolute Neutrophil 6.3      Auto Absolute Lymphocyte 2.3      Auto Absolute Monocyte 0.7      Auto Absolute Eosinophil 0.1      Auto Absolute Basophil 0.1      Auto Nucleated Rbc Absolute 0.0      nRBC 0.0     LIPASE - Normal    LIPASE 17     PTT (APTT) - Normal    PTT (aPTT) 26.9     ETHANOL - Normal    ETHANOL <10      Narrative:     The clinical interpretation of results < 10.0 mg/dL is negative.    TYPE AND SCREEN    ABORH O POS      Antibody Screen NEG      Crossmatch Status NTYPE      Specimen Expiration 10/27/21     EXTRA TUBES    Narrative:     The following  orders were created for panel order Extra Tubes.                  Procedure  Abnormality         Status                                     ---------                               -----------         ------                                     Extra Tube OEVO[[id number]]                                  Final result                                 Please view results for these tests on the individual orders.   EXTRA TUBE GOLD    Extra Tube Hold for add-ons.     ABORH2    ABORH2 O POS      Crossmatch Status READY      Specimen Expiration 10/27/21     URINALYSIS   LACTIC ACID REFLEX   DRUG SCREEN, URINE     Radiology Studies:   CT CERVICAL SPINE WITHOUT CONTRAST    Result Date: 10/24/2021  --------------------------------------------- CT of the Cervical Spine INDICATION: Fall, neck injury Multiple axial images were obtained through the cervical spine without intravenous contrast. Coronal and sagittal reformatted images were also reviewed.  Radiation dose reduction techniques were used for this study. All CT scans obtained at this facility  use automated exposure control, adjustment of the mA and/or kV according to patient size, and/or iterative reconstruction. FINDINGS: There is multilevel degenerative change.  Disc base narrowing is most prominent at C5-6 and C6-7.  Small calcified midline disc herniation is present at C2-3.  There is no evidence of fracture or subluxation.  No bony lesions are seen.  There is no prevertebral soft tissue swelling.  There are chronic changes in the left lung apex.     IMPRESSION: Multilevel degenerative change.  No acute abnormality noted in cervical spine    XR SHOULDER MINIMAL 2 VW RIGHT    Result Date: 10/24/2021  --------------------------------------------- Right shoulder CLINICAL INDICATION: Right shoulder pain after fall FINDINGS: 3 views of the right shoulder are submitted. There is no fracture. There is no dislocation. The joint spaces are maintained.      IMPRESSION: No acute osseous abnormality or joint derangement of the right shoulder.    CT HEAD WITHOUT CONTRAST    Result Date: 10/24/2021  --------------------------------------------- Head CT INDICATION: Fall, head injury Multiple axial images obtained through the brain without intravenous contrast.  Radiation dose reduction techniques were used for this study. All CT scans obtained at this facility use automated exposure control, adjustment of the mA and/or kV according to patient size, and/or iterative reconstruction. COMPARISON:  None FINDINGS: - BRAIN: There is subdural hemorrhage along the left tentorium extending into the posterior interhemispheric fissure.  Measures up to 4 mm in thickness.  No significant mass effect.  No other evidence of acute hemorrhage.  VP shunt is present from a right parietal approach crossing  the midline.  Ventricles are normal in size. There are no intracranial masses. - VASCULAR: Unremarkable. - SKULL: Surgical changes.  No fractures. - SINUSES/ORBITS: The visualized paranasal sinuses and mastoid air cells are pneumatized. The globes are symmetric and intact.     IMPRESSION: Posterior left subdural hemorrhage Critical results communicated to Dr.  Raenette Rover   at 5:30 p.m. DC5     XR CHEST 1 VW    Result Date: 10/24/2021  --------------------------------------------- Portable chest x-ray CLINICAL INDICATION: Fall FINDINGS: Single AP view of the chest show the lungs to be expanded and clear. No pleural effusion or pneumothorax. The cardiac silhouette and mediastinum are unremarkable. The bones are osteopenic.     IMPRESSION: No acute abnormality.     COUNSELING     Counseling: The emergency provider has spoken with the patient and family and discussed today?s findings, in addition to providing specific details for the plan of care.  Questions are answered and there is agreement with the plan.      SCRIBE ATTESTATION     By signing my name below, I, Weldon Picking, attest that this  documentation has been prepared under the direction and in the presence of [employer]*. Electronically signed: Weldon Picking, Scribe. 10/24/2021, 5:06 PM.      Luretha Rued Schmeissin* I personally performed the services described in this documentation. All medical record entries made by the scribe were at my direction and in my presence. I have reviewed the chart and agree that the record reflects my personal performance and is accurate and complete.  10/24/2021, 5:06 PM.        Weldon Picking  10/24/21 1728      Scott Lovenia Kim, MD  10/24/21 1809      Milana Huntsman, MD  10/24/21 1844      Milana Huntsman, MD  10/24/21 8546      Milana Huntsman, MD  10/24/21 1902      Milana Huntsman, MD  10/24/21 [place], MD  10/24/21 2009    Electronically signed by Milana Huntsman, MD at 10/24/2021  8:09 PM EDT

## 2021-10-24 NOTE — H&P (Signed)
Formatting of this note is different from the original.  Images from the original note were not included.      [facility]  Level 1 Trauma Center    History & Physical        Attending: Durward Parcel  Chief Resident: Dr. Eliot Ford    Assessment/Plan      Assessment: Patient is a 71 y.o. female who presents after a fall from standing found to have a small left subdural hemorrhage.    Consults Requested by Trauma Service:   - Neurosurgery for subdural hemorrhage; time called: 1630, time responded: 1630    Trauma Plan:  - Plan for admission for neuro checks, PT/OT, and pain control  -Keppra  - Repeat head CT in the morning    Problem List Populated:   Problem   Fall From Standing     Principal Problem:    Subdural hemorrhage (CMS/HCC)  Active Problems:    Fall from standing    Patient arrival date:  10/24/2021  Patient arrival time:  3:50 PM    The patient's history and or updated history, laboratory studies, radiologic images, resident documentation were personally reviewed by me with Dr. Calton Dach. The patient was examined by me. I concur with the residents note and agree with the documented findings and plan of care.    Helene Kelp, MD, FACS, [facility]  Trauma/Surgical Critical Care      Subjective      CHIEF COMPLAINT/HPI:  Heather Waters is a 71 y.o. female with a history of  anxiety and breast cancer who presents after a fall fro standing. She states she accidentally tripped on her granddaughter who was playing with her feet when she fell and hit her head on the floor. She is having a mild headache, but otherwise denies any change sin her vision or loss of consciousness. No other pain or discomfort or deformities.       Mode of arrival: accompanied by family member patient and daughter  Trauma Team time of arrival: 1630  Trauma activation: Consult  Estimated time of injury: 1400    Mechanism:  Mechanism of Injury: Fall - height: standing  LOC: No  Ambulatory on scene: Yes  Hemodynamics:  stable  Intubation: no  Intoxicated: No    Primary Survey   Airway: patent and talking  Breathing: spontaneous  Chest tube: no  Circulation: Pulses: radial: R palpable/ L palpable , dorsalis pedis: R palpable/ L palpable     Disability  Glasgow Coma Scale: 15  Pupils: right: reactive and brisk, left: reactive and brisk  Motor/sensory intact x4 extremities: Yes  Exposure: partial    FAST: not examined    Secondary Survey   Allergies:   Allergies   Allergen Reactions    Walnut      Medications: unable to obtain  Past Medical History: History reviewed. No pertinent past medical history.  Past Surgical History: No past surgical history on file.  Social History:   Social History     Socioeconomic History    Marital status: Married     Spouse name: Not on file    Number of children: Not on file    Years of education: Not on file    Highest education level: Not on file   Occupational History    Not on file   Tobacco Use    Smoking status: Not on file    Smokeless tobacco: Not on file   Substance and Sexual Activity  Alcohol use: Not on file    Drug use: Not on file    Sexual activity: Not on file   Other Topics Concern    Not on file   Social History Narrative    Not on file     Social Determinants of Health     Financial Resource Strain: Not on file   Food Insecurity: Not on file   Transportation Needs: Not on file   Physical Activity: Not on file   Stress: Not on file   Social Connections: Not on file   Intimate Partner Violence: Not on file   Housing Stability: Not on file     Review of Systems (mandatory):   Respiratory: Patient denies shortness of breath, coughing up blood, asthma/wheezing, chronic cough, emphysema, tuberculosis, sleep apnea or excessive snoring, chest discomfort or tightness (angina), recent cold, bronchitis and pneumonia  Cardiovascular: Patient denies palpitations, heart murmur, heart failure, chest pain, heart attack, blood clots, mitral valve prolapse, irregular heart beat and  phlebitis/swelling in legs   Neurologic: Patient denies headache, dizziness, numbness, weakness, or change in mental status.  Hematologic: Patient denies easy bruising, abnormal bleeding, anemia and swollen lymph nodes  Gastrointestinal:Patient denies abdominal pain, constipation or diarrhea.  Endocrine: Patient denies diabetes or thyroid disorder.    Additional Systems as Clinically Indicated:  Constitutional:  HEENT:  Genitourinary:  Musculoskeletal:   Skin/Breast:  Psychiatric:      Objective    Blood pressure 117/74, pulse 101, temperature 98.7 F (37.1 C), temperature source Oral, resp. rate 20, SpO2 95 %.  C-collar: No  Boarded: No  Splints: No  HEENT: head normocephalic and atraumatic  Neck: trachea midline  Chest: CTAB and symmetrical chest wall movement  Cardiovascular: RRR  Abdomen: Benign  GU: no blood at the meatus  Rectal: deferred  Extremities: no gross deformity, moving all 4 extremities  Neuro: strength equal b/l U/LE and sensation grossly intact  Pelvis: stable  Back: no deformity    Adjuvant Procedures:   Medications given:     History reviewed. No pertinent past medical history.  No past surgical history on file.  No current facility-administered medications on file prior to encounter.     Current Outpatient Medications on File Prior to Encounter   Medication Sig Dispense Refill    clonazePAM (use for KlonoPIN) 1 mg tablet Take 1 mg by mouth nightly      gabapentin (use for NEURONTIN) 300 mg capsule Take 300 mg by mouth in the morning and 300 mg at noon and 300 mg in the evening and 300 mg before bedtime.      omeprazole (PriLOSEC) 40 mg capsule Take 40 mg by mouth in the morning.      DULoxetine (use for CYMBALTA) 60 mg capsule Take 60 mg by mouth in the morning.       Allergies   Allergen Reactions    Walnut      Social History     Socioeconomic History    Marital status: Married     Spouse name: Not on file    Number of children: Not on file    Years of education: Not on file    Highest  education level: Not on file   Occupational History    Not on file   Tobacco Use    Smoking status: Not on file    Smokeless tobacco: Not on file   Substance and Sexual Activity    Alcohol use: Not on file    Drug  use: Not on file    Sexual activity: Not on file   Other Topics Concern    Not on file   Social History Narrative    Not on file     Social Determinants of Health     Financial Resource Strain: Not on file   Food Insecurity: Not on file   Transportation Needs: Not on file   Physical Activity: Not on file   Stress: Not on file   Social Connections: Not on file   Intimate Partner Violence: Not on file   Housing Stability: Not on file     History reviewed. No pertinent family history.    Scheduled Meds:acetaminophen, 650 mg, Oral, Q6H  clonazePAM, 1 mg, Oral, Nightly  DULoxetine, 60 mg, Oral, Daily  gabapentin, 300 mg, Oral, 4x Daily  insulin regular, 0-28 Units, Subcutaneous, Q6H  pantoprazole, 20 mg, Oral, Daily    Continuous Infusions:   PRN Meds:.  dextrose **OR** dextrose **OR** glucagon (human recombinant)    ondansetron    oxyCODONE    oxyCODONE    oxyCODONE    Labs:  No results found for: PHBG, PCO2, PO2, HCO3, O2SAT, BE, FIO2  WBC   Date Value Ref Range Status   10/25/2021 6.2 3.7 - 10.6 x 10*3/uL Final     HGB   Date Value Ref Range Status   10/25/2021 12.1 11.0 - 14.9 g/dL Final     HCT   Date Value Ref Range Status   10/25/2021 36.4 32.6 - 43.4 % Final     Platelets   Date Value Ref Range Status   10/25/2021 215 141 - 359 x 10*3/uL Final     Prothrombin Time   Date Value Ref Range Status   10/24/2021 12.3 (L) 12.7 - 15.1 seconds Final     INR   Date Value Ref Range Status   10/24/2021 0.9 (L) 2.0 - 3.0 Final     PTT (aPTT)   Date Value Ref Range Status   10/24/2021 26.9 23.3 - 36.3 seconds Final     Sodium   Date Value Ref Range Status   10/25/2021 139 133 - 146 mmol/L Final     Potassium   Date Value Ref Range Status   10/25/2021 3.8 3.5 - 5.2 mmol/L Final     Chloride   Date Value Ref Range  Status   10/25/2021 103 100 - 111 mmol/L Final     Carbon Dioxide   Date Value Ref Range Status   10/25/2021 29.2 23.0 - 32.6 mmol/L Final     Urea Nitrogen   Date Value Ref Range Status   10/25/2021 7 7 - 25 mg/dL Final     Creatinine   Date Value Ref Range Status   10/25/2021 0.83 0.47 - 1.35 mg/dL Final     Glucose   Date Value Ref Range Status   10/25/2021 109 (H) 70 - 100 mg/dL Final     Calcium   Date Value Ref Range Status   10/25/2021 8.8 (L) 8.9 - 10.3 mg/dL Final     MAGNESIUM   Date Value Ref Range Status   10/25/2021 1.6 (L) 1.7 - 2.4 mg/dL Final     PHOSPHOROUS   Date Value Ref Range Status   10/25/2021 3.4 2.3 - 4.3 mg/dL Final     Aspartamine Transferase   Date Value Ref Range Status   10/25/2021 11 (L) 12 - 33 IU/L Final     Alanine Transferase   Date Value Ref  Range Status   10/25/2021 5 (L) 8 - 39 IU/L Final     Alkaline Phosphatase   Date Value Ref Range Status   10/25/2021 87 36 - 108 IU/L Final     Total Bilirubin   Date Value Ref Range Status   10/25/2021 0.5 0.0 - 1.5 mg/dL Final     Total Protein   Date Value Ref Range Status   10/25/2021 6.5 6.1 - 8.0 g/dL Final     Albumin   Date Value Ref Range Status   10/25/2021 3.5 3.5 - 4.9 g/dL Final     No results found for: [id number], IRWERX540, GQQ761, [id number], [id number], [id number], PFXTK240, XBDZHGD924, UBLOOD165, [id number], [id number], [id number], QAST419, QQIWL798, [id number]  Heparinized Specimen   Date Value Ref Range Status   10/24/2021 No  Final     Rate   Date Value Ref Range Status   10/24/2021 4.4 (L) 5.0 - 10.0 minutes Final     Kinetics   Date Value Ref Range Status   10/24/2021 1.1 1.0 - 3.0 minutes Final     Alpha Angle   Date Value Ref Range Status   10/24/2021 73.1 (H) 53.0 - 72.0 degrees Final     Maximum Amplitude   Date Value Ref Range Status   10/24/2021 72.4 (H) 50.0 - 70.0 millimeters Final     Percent Lysis at 30 Minutes   Date Value Ref Range Status   10/24/2021 0.0 0.0 - 8.0 % Final     Radiology Studies:  Chest x-ray:   reviewed  CT Head:  reviewed  CT Cervical Spine:  reviewed  CT Chest:  reviewed      Garnetta Buddy, MD    Co-Morbidity, Complication, Code-Status and SBIRT (if applicable)    Mechanism of Injury:Mechanism of Injury: Fall - height: standing    Co-Morbidities Present on Admission    Code Status: Full Code    Attending Attestation:    Patient arrival date:  10/24/2021  Patient arrival time:  3:50 PM    Trauma attending arrival time: 1630    Vira Agar, D.O.  [facility] Surgery Resident - PGY1  Pager: [phone]       Electronically signed by Garnetta Buddy, MD at 10/25/2021  3:37 PM EDT

## 2021-10-25 NOTE — Progress Notes (Signed)
Formatting of this note is different from the original.  [facility]  ACS/Trauma   Progress Note    10/25/21  10:51 PM    Hospital Day: 1    Assessment/Plan:  Principal Problem:    Subdural hemorrhage (CMS/HCC)  Active Problems:    Fall from standing    -Neurosurgery consulted  > Repeat head CT unchanged, stable exam  > Follow-up 1 week outpatient    -PT/OT/DC planning    The patient's history and or updated history, laboratory studies, radiologic images, resident documentation were personally reviewed by me with Dr.  Stasia Cavalier . The patient was examined by me. I concur with the residents note and agree with the documented findings and plan of care.  The patient's plan of care has been determined and the patient is progressing with this same plan of care.    Helene Kelp, MD, FACS, [facility]  Trauma/Surgical Critical Care    Subjective:  Patient AOx1 initially, then when team rounding AOx3. Disorientation likely due to being woken up first time.    Vital Signs:  Temp (24hrs), Avg:98.6 F (37 C), Min:98.3 F (36.8 C), Max:98.9 F (37.2 C)    Vitals:    10/25/21 0502 10/25/21 1139 10/25/21 1530 10/25/21 2038   BP: 111/61 (!) 129/84 117/74 114/71   BP Cuff Site Location: Right arm Right arm Right arm Right arm   Patient Position: Lying   Lying   BP Cuff Size : Pediatic Regular Regular Regular   Pulse: 95 113 101 92   Resp: 18 20 20 18    Temp: 98.6 F (37 C) 98.7 F (37.1 C) 98.7 F (37.1 C) 98.9 F (37.2 C)   TempSrc: Oral Oral Oral Oral   SpO2: 93% 95% 95% 93%     Physical Exam:  Physical Exam  HENT:      Head: Normocephalic.   Eyes:      Extraocular Movements: Extraocular movements intact.   Cardiovascular:      Rate and Rhythm: Tachycardia present.   Pulmonary:      Effort: Pulmonary effort is normal.   Abdominal:      Palpations: Abdomen is soft.   Skin:     General: Skin is warm and dry.   Neurological:      General: No focal deficit present.      Mental Status: She is alert.    Psychiatric:         Mood and Affect: Mood normal.     Labs and Imaging reviewed    , MD    Electronically signed by Amado Coe, MD at 10/26/2021  5:21 PM EDT

## 2021-10-25 NOTE — Assessment & Plan Note (Signed)
Formatting of this note might be different from the original.    Problem: Cognitive:  Goal: Knowledge of risk factors and measures for prevention of condition will improve  Outcome: Progressing    Problem: Safety:  Goal: Will remain free from falls  Outcome: Progressing    Problem: Cognitive:  Goal: Will demonstrate different strategies to decrease or manage pain  Outcome: Progressing    Problem: Sensory:  Goal: Pain level will decrease  Outcome: Progressing    Electronically signed by Earney Mallet, RN at 10/25/2021  5:56 PM EDT

## 2021-10-25 NOTE — Consults (Signed)
Associated Order(s): IP CONSULT TO NEUROSURGERY  Formatting of this note is different from the original.  Images from the original note were not included.      Neurosurgery Consult Note    Assessment/Plan      Small left tentorial SDH in a 71 y.o. s/p fall from standing.  Patient is awake alert and oriented to person, place and time  CN II-XII are intact  Recommend PT evaluation today  Repeat CT head unchanged on 14 hr follow up scan  Patient can follow up in 1 week in the office    Medical Problems       Hospital Problem List       * (Principal) Subdural hemorrhage (CMS/HCC)             Subjective      HPI:  Heather Waters is a 71y.o. female with PMHx of anxiety and breast Ca. Patient states she tripped on her granddaughter and had a fall from standing yesterday hitting her head    History reviewed. No pertinent past medical history.   No past surgical history on file.  Allergies   Allergen Reactions    Walnut          Objective      Vitals:    10/24/21 1823 10/24/21 2057 10/25/21 0031 10/25/21 0502   BP: (!) 134/98 131/81 109/73 111/61   BP Cuff Site Location:  Right arm Right arm Right arm   Patient Position:  Lying Lying Lying   BP Cuff Size :  Neonatal Pediatic Pediatic   Pulse: 87 96 88 95   Resp: 17 18 18 18    Temp:  98.1 F (36.7 C) 98.3 F (36.8 C) 98.6 F (37 C)   TempSrc:  Oral Oral Oral   SpO2: 99% 100% 95% 93%     Temp (24hrs), Avg:98.3 F (36.8 C), Min:98 F (36.7 C), Max:98.6 F (37 C)    Physical Exam  Pupils are < 3 mm round and brisk bilaterally  Extra ocular movements are intact no nystagmus noted  Facies are symmetric  Tongue is midline, speech is clear and fluent  Hearing is intact  Following all commands and moving all extremities    CT HEAD WITHOUT CONTRAST    Result Date: 10/25/2021  EXAMINATION: CT HEAD WITHOUT CONTRAST DATE: 10/25/2021 7:35 AM  HISTORY: Follow-up subdural hematoma COMPARISON: 10/24/2021 TECHNIQUE: Thin section noncontrast axial images were obtained through the head.  Coronal and sagittal reformatted images were then created. CT dose lowering techniques including automated exposure control, adjustment for patient size, and/or use of iterative reconstruction were used. FINDINGS: Intracranial Hemorrhage: Unchanged left posterior parafalcine and tentorial subdural hematoma measuring up to 4 mm in maximum thickness. No significant mass effect or midline shift. Ventricles: Unchanged size and configuration of the ventricular system. Ventricles remain slitlike in appearance. 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 Parenchyma: Patchy periventricular white matter low-attenuation, most likely from small vessel ischemic disease, similar to prior exam. Gliosis along the course of the ventricular shunt catheter, unchanged. No CT evidence of an acute large-vessel territory infarct.    Bones/Extracranial soft tissues: Right temporoparietal burr hole. Right parietal scalp hematoma, unchanged. No acute calvarial fracture. Visualized Sinuses/Mastoids: Mild, scattered paranasal sinus mucosal thickening.  No air fluid level.  The mastoid air cells and middle ears are grossly clear.      IMPRESSION:  1.  Unchanged left posterior parafalcine and tentorial subdural hematoma measuring up to 4 mm in thickness.  Electronically  signed by:  Thornton Dales, M.D. 10/25/2021 8:06:00 AM    CT CERVICAL SPINE WITHOUT CONTRAST    Result Date: 10/24/2021  --------------------------------------------- CT of the Cervical Spine INDICATION: Fall, neck injury Multiple axial images were obtained through the cervical spine without intravenous contrast. Coronal and sagittal reformatted images were also reviewed.  Radiation dose reduction techniques were used for this study. All CT scans obtained at this facility  use automated exposure control, adjustment of the mA and/or kV according to patient size, and/or iterative reconstruction. FINDINGS: There is multilevel degenerative change.  Disc base narrowing is most  prominent at C5-6 and C6-7.  Small calcified midline disc herniation is present at C2-3.  There is no evidence of fracture or subluxation.  No bony lesions are seen.  There is no prevertebral soft tissue swelling.  There are chronic changes in the left lung apex.     IMPRESSION: Multilevel degenerative change.  No acute abnormality noted in cervical spine    XR SHOULDER MINIMAL 2 VW RIGHT    Result Date: 10/24/2021  --------------------------------------------- Right shoulder CLINICAL INDICATION: Right shoulder pain after fall FINDINGS: 3 views of the right shoulder are submitted. There is no fracture. There is no dislocation. The joint spaces are maintained.     IMPRESSION: No acute osseous abnormality or joint derangement of the right shoulder.    CT HEAD WITHOUT CONTRAST    Result Date: 10/24/2021  --------------------------------------------- Head CT INDICATION: Fall, head injury Multiple axial images obtained through the brain without intravenous contrast.  Radiation dose reduction techniques were used for this study. All CT scans obtained at this facility use automated exposure control, adjustment of the mA and/or kV according to patient size, and/or iterative reconstruction. COMPARISON:  None FINDINGS: - BRAIN: There is subdural hemorrhage along the left tentorium extending into the posterior interhemispheric fissure.  Measures up to 4 mm in thickness.  No significant mass effect.  No other evidence of acute hemorrhage.  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  Ventricles are normal in size. There are no intracranial masses. - VASCULAR: Unremarkable. - SKULL: Surgical changes.  No fractures. - SINUSES/ORBITS: The visualized paranasal sinuses and mastoid air cells are pneumatized. The globes are symmetric and intact.     IMPRESSION: Posterior left subdural hemorrhage Critical results communicated to Dr.  Reeves Dam   at 5:30 p.m. DC5     XR CHEST 1 VW    Result Date:  10/24/2021  --------------------------------------------- Portable chest x-ray CLINICAL INDICATION: Fall FINDINGS: Single AP view of the chest show the lungs to be expanded and clear. No pleural effusion or pneumothorax. The cardiac silhouette and mediastinum are unremarkable. The bones are osteopenic.     IMPRESSION: No acute abnormality.         Pincus Sanes, PA      Electronically signed by Lillia Abed, MD at 10/28/2021  1:04 PM EDT

## 2021-10-25 NOTE — Unmapped (Signed)
Formatting of this note might be different from the original.  [facility]    Physician Advisor Medical Necessity Determination    Patient Name: Heather Waters  Date of Birth:   March 22, 1950  Patient CSN:   [id number]  Admit Date:   10/24/2021  Payor:   Payor: [employer] MEDICARE ADVANTAGE / Plan: [facility] MEDICARE SOLUTIONS [phone] / Product Type: *No Product type* /     Type of Letter:  Determination: Patient Class: Observation    Clinical Impression:  Observation    Heather Waters is a 71 y.o. female whose past medical history includes:History reviewed. No pertinent past medical history.    At the time of this PA review, today is Hospital Day: 2.    The patient was admitted to [facility] on 10/24/2021  3:53 PM with complaints of mechanical fall. Heather Waters is a 71 y.o. female with past medical history of significant medical problems presented to the hospital status post fall.  Patient denies any loss of consciousness.  Patient found to have a subdural hematoma.  Patient admitted to the hospital for evaluation and treatment.  No surgical intervention planned at this time. Patient meets observation status at this time.    Based on the above discussion (including findings and treatment), the patient's care is most consistent with Observation care at this time.     Determination by: Dorothy Puffer, MD  Physician Advisor  Utilization Management Committee    This document is part of the process utilized by [facility] to ensure compliance with the CMS policy regarding Inpatient Admissions and Observation Services.  The definitions of "Inpatient" and "Observation" used in making the determination above are those provided in the Medicare Benefit policy Manual, Chapter 1 Sections 1 and 10, Chapter 6 Section 20, and the Medicare Claim Processing Manual, Chapter 1 Section 50.3 and Chapter 4 Section 290.  The information and recommendation in this document is  made pursuant to the [facility] Conditions of Participation (42 CFR Part 482) and is intended as part of the [facility] obligation to comply with 42 CFR Part 482.  The information is this document is to be used for utilization review and utilization management purposes only and should not be considered as an assessment of the appropriateness or quality of clinical judge or the medical care provided to the above named patient.  Nothing in this document may be used to limit clinical services provided to the above named patient nor is this document intended to question the clinical care provided or to provide advice on clinical care that may be indicated for the patient.  The information in this document should be considered as only one factor in determining the patient's final level of service, along with the other pertinent documentation such as the treating physician's order as documented evidence of concurrence.  The provider of services is ultimately responsible for correct coding and billing of services.      Electronically signed by Dorothy Puffer, MD at 10/25/2021  4:47 PM EDT

## 2021-10-25 NOTE — Assessment & Plan Note (Signed)
Formatting of this note might be different from the original.    Problem: Cognitive:  Goal: Knowledge of risk factors and measures for prevention of condition will improve  Outcome: Progressing    Problem: Safety:  Goal: Will remain free from falls  Outcome: Progressing    Problem: Cognitive:  Goal: Will demonstrate different strategies to decrease or manage pain  Outcome: Progressing    Problem: Sensory:  Goal: Pain level will decrease  Outcome: Progressing    Electronically signed by Lucilla Lame, RN at 10/25/2021 10:18 PM EDT

## 2021-10-26 NOTE — Assessment & Plan Note (Signed)
Formatting of this note might be different from the original.    Problem: Cognitive:  Goal: Knowledge of risk factors and measures for prevention of condition will improve  Outcome: Progressing    Problem: Safety:  Goal: Will remain free from falls  Outcome: Progressing    Problem: Cognitive:  Goal: Will demonstrate different strategies to decrease or manage pain  Outcome: Progressing    Problem: Sensory:  Goal: Pain level will decrease  Outcome: Progressing    Electronically signed by Georganna Skeans, RN at 10/26/2021 10:35 PM EDT
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
## 2021-10-26 NOTE — Progress Notes (Signed)
Formatting of this note is different from the original.  [facility]  ACS/Trauma   Progress Note    10/26/21  1:41 PM    Hospital Day: 2    Assessment/Plan:  Principal Problem:    Subdural hemorrhage (CMS/HCC)  Active Problems:    Fall from standing    -Neurosurgery consulted  > Repeat head CT unchanged  > Follow-up 1 week outpatient    -PT/OT  > PT signing off, rec home with caregiver    Subjective:  Pt continues to be oriented. NAEO.    Vital Signs:  Temp (24hrs), Avg:98.2 F (36.8 C), Min:97.5 F (36.4 C), Max:98.9 F (37.2 C)    Vitals:    10/25/21 2319 10/26/21 0410 10/26/21 0724 10/26/21 1124   BP: 122/79 (!) 140/71 125/75 (!) 149/89   BP Cuff Site Location: Right arm Left arm Right arm Right arm   Patient Position: Lying Lying Lying Sitting   BP Cuff Size : Regular Regular Regular Regular   Pulse: 98 78 89 99   Resp: 18 18 18 18    Temp: 98.4 F (36.9 C) 97.5 F (36.4 C) 97.7 F (36.5 C) 98.1 F (36.7 C)   TempSrc: Oral Oral Oral Oral   SpO2: 94% 93% 94% 98%     Physical Exam:  Physical Exam  HENT:      Head: Normocephalic.   Eyes:      Extraocular Movements: Extraocular movements intact.   Cardiovascular:      Rate and Rhythm: Tachycardia present.   Pulmonary:      Effort: Pulmonary effort is normal.   Abdominal:      Palpations: Abdomen is soft.   Skin:     General: Skin is warm and dry.   Neurological:      General: No focal deficit present.      Mental Status: She is alert.   Psychiatric:         Mood and Affect: Mood normal.     Labs and Imaging reviewed    , MD    Electronically signed by Amado Coe, MD at 10/28/2021 11:10 AM EDT    Associated attestation - 12/28/2021, MD - 10/28/2021 11:10 AM EDT  Formatting of this note is different from the original.  Images from the original note were not included.      [facility]  Trauma/Critical Care/Acute Care Surgery  Progress Note        Attending Attestation:    I  concur with the assessment and plan outlined in this documentation  Any edits, addendums, or changes will appear below if applicable.    Concur    Daily Summary/Plan of Care:    Neuro Checks  DC Planning    Continue multi-system supportive care    The degree of Medical Decision Making (MDM) used:  Low    Patient Active Problem List   Diagnosis    Subdural hemorrhage (CMS/HCC)    Fall from standing     Acute Care Surgical Services were provided,   Including:    Patient Evaluation  Management (Including, but not limited to) of:  Neurologic, Respiratory, Hemodynamic, GI, Renal, Electrolyte, Nutrition, ID, Wounds, Injuries  Coordination of Consultants, Nursing And Ancillary Services  Discharge Planning  Documentation    An update to the patient and/or their representative    I saw and evaluated the patient, participating in the key portions of the service.  I reviewed the resident?s note.  I agree with the resident?s findings and plan.     I have provided oversight of the surgical housestaff    Including:  Review of the chart and relevant old records  Personal review of all laboratory and imaging reports,   (including direct review of the images)  Discussion and review of test results and plan with consulting teams  Review of recommendations of all ancillary services    Update of the patient/family of the treatment plan,   Review of Code status and discharge planning    The assessment and plan was formulated with my knowledge and direct input  I agree with the documentation above, except as noted by my comments    Minerva Fester, MD FACS  Trauma, Critical Care, Acute Care Surgery  Pager: [phone]  Office: [phone]
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
## 2021-10-27 NOTE — Assessment & Plan Note (Signed)
Formatting of this note might be different from the original.    Problem: Cognitive:  Goal: Knowledge of risk factors and measures for prevention of condition will improve  10/27/2021 1310 by Leanne Lovely, RN  Outcome: Not Progressing  10/27/2021 1209 by Leanne Lovely, RN  Outcome: Progressing    Problem: Safety:  Goal: Will remain free from falls  10/27/2021 1310 by Leanne Lovely, RN  Outcome: Not Progressing  10/27/2021 1209 by Leanne Lovely, RN  Outcome: Progressing    Problem: Cognitive:  Goal: Will demonstrate different strategies to decrease or manage pain  Outcome: Not Progressing    Problem: Sensory:  Goal: Pain level will decrease  10/27/2021 1310 by Leanne Lovely, RN  Outcome: Not Progressing  10/27/2021 1209 by Leanne Lovely, RN  Outcome: Progressing    Electronically signed by Leanne Lovely, RN at 10/27/2021  1:10 PM EDT

## 2021-10-27 NOTE — Progress Notes (Signed)
Formatting of this note is different from the original.     10/27/21 1235   Referral Data   Assessment Type (Required) Initial   Referral Source CM assessed needs   Referral Comments Pt admitted after a FFS. Diagnosed with a small SDH. Per physiciam can return to [facility] in 1 weeks time. Pt denies needs for HHS and will be returning home with her Juluis Rainier today until her week is up to go back to [facility]. Denies any other needs. Pt to discharge to home with no new needs.   Referral Reason Discharge Planning   Readmission Review   Is this a readmission? No   [employer] patient resides Other (comment)  [facility] [place])   Patient Information   Patient-stated Goal Go to home   Admitted From [facility] Address Verified Yes  ([address] [place])   Caregiver Name/ Relationship/ Contact Number soni kegel son [phone]   [employer] Biomedical scientist Emergency Department       Electronically signed by Mateo Flow, RN at 10/27/2021 12:40 PM EDT

## 2021-10-27 NOTE — Discharge Summary (Signed)
Formatting of this note is different from the original.  Images from the original note were not included.      [facility]  Level 1 Trauma Center    Discharge Summary      Name: Heather Waters  Date of Birth: 1950-11-21  MRN# [id number]    Date of Admission: 10/24/2021  Admitting Service: Trauma/ACS, Garnetta Buddy*    Date of Discharge: 10/27/2021    Condition at Discharge:    Moderate Disability (expected ability to Self-Care)    Admitting Diagnosis:  Subdural hemorrhage (CMS/HCC) [I62.00]    Discharge Diagnoses:  Patient Active Problem List   Diagnosis    Subdural hemorrhage (CMS/HCC)    Fall from standing     Procedures:none    Consultants: (Name/Speciality)  Dr Esce-neurosurgery    Diet:   Adult Diet: Regular    Activity/Weight bearing status:   As tolerated    DVT Prophylaxis: None    Wound Care (type of dressing/freqency of dressing change): As needed    Follow-up:  PCP in 1 week  Dr Ena Dawley in 1 weeks    Home Health: PT OT [facility]    Medications per Medicine Reconciliation Form     Social Work/Needs:  Patient has screened negative for EtOH    SBIRT has been completed    Intervention/Referral to Treatment: not indicated     Brief Hospital Course   Heather Waters is a 71 y.o. year-old female who was admitted on 9/2 after a fall from standing.  After initial assessment evaluation she was diagnosed a left subdural hematoma.  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  Neurosurgery was consulted and recommended nonoperative management with outpatient follow-up.  She was here visiting her daughter at the time of the injury.  It was mechanical her her history.  She continued to progress well with therapies and at time of discharge her physical exam is as below  Const: NAD, endorsing intermittent headaches  HEENT: Head NC AT PERRLA  Neck: Supple no JVD Noted  Resp: regular respirations no  respiratory distress  CV: RRR palpable radial and DP pulses  GI: abdomen soft NT ND no g/r/r noted  Skin: w/d/i  MS: MAE  Neuro: GCS 15  Psych: normal mood A&OX3    She is stable for discharge home with follow-up as above.  Please call any questions.  Time spent on care for coordination of discharge was 38 minutes for chart review, medication reconciliation, patient education, and coordination of services with case management, nursing, etc. This included a face-to-face encounter on date of discharge.    Amy V. Hamrick AGACNP      Electronically signed by Garnetta Buddy, MD at 10/27/2021  3:54 PM EDT

## 2021-10-27 NOTE — Assessment & Plan Note (Signed)
Formatting of this note might be different from the original.    Problem: Cognitive:  Goal: Knowledge of risk factors and measures for prevention of condition will improve  Outcome: Progressing    Problem: Safety:  Goal: Will remain free from falls  Outcome: Progressing    Problem: Sensory:  Goal: Pain level will decrease  Outcome: Progressing    Electronically signed by Leanne Lovely, RN at 10/27/2021 12:09 PM EDT
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
## 2022-02-03 NOTE — Telephone Encounter (Signed)
Formatting of this note might be different from the original.  Portal message sent  Electronically signed by Derald Macleod, MA at 02/03/2022  3:23 PM MST

## 2022-02-03 NOTE — Telephone Encounter (Signed)
Formatting of this note might be different from the original.  I am helping Dr. Mardene Celeste out with her controlled substance patients.     Patients needs controlled substance agreement for their clonazepam 1 mg 135 ct.     Please call patient and get them scheduled with Dr. Mardene Celeste to sign and discuss controlled substance agreement. Also, update urine drug screen. This can be at their next 3 month visit if they have been seen in clinic recently.     This contract then needs to be scanned in to "Doc List" to appear on patient Story board correctly.     If this is not signed & discussed, then patient is at risk of D/C'ing future refills, as this is against clinic internal prescribing guidelines. If patient not able to obtain an appt with her before their next fill, a short/temporary refill may be sent until patient is seen in clinic. Thanks.     **Provider note to review: patient over the 120 ct/month per clinic prescribing guidelines. Current at 135 count/month. Provider discretion, but currently patient is not within clinic guidelines.   Electronically signed by Charlene Brooke Prudic, NP at 02/03/2022  1:33 PM MST

## 2022-02-09 NOTE — Telephone Encounter (Signed)
Formatting of this note might be different from the original.  Patient has visit on 03/01/22, updated note  Electronically signed by Derald Macleod, MA at 02/09/2022  8:01 AM MST

## 2022-02-13 ENCOUNTER — Emergency Department: Admit: 2022-02-13 | Payer: MEDICARE

## 2022-02-13 ENCOUNTER — Inpatient Hospital Stay
Admit: 2022-02-13 | Discharge: 2022-02-13 | Disposition: A | Discharge status: Home / Self Care | Payer: MEDICARE | Attending: Emergency Medicine

## 2022-02-13 DIAGNOSIS — M1611 Unilateral primary osteoarthritis, right hip: Secondary | ICD-10-CM

## 2022-02-13 MED ORDER — KETOROLAC TROMETHAMINE 10 MG PO TABS
10 MG | ORAL_TABLET | Freq: Four times a day (QID) | ORAL | 0 refills | Status: AC | PRN
Start: 2022-02-13 — End: ?

## 2022-02-13 MED ORDER — NAPROXEN 250 MG PO TABS
250 MG | ORAL | Status: AC
Start: 2022-02-13 — End: 2022-02-13
  Administered 2022-02-13: 20:00:00 500 mg via ORAL

## 2022-02-13 MED FILL — NAPROXEN 250 MG PO TABS: 250 MG | ORAL | Qty: 2

## 2022-02-13 NOTE — ED Provider Notes (Signed)
Emergency Department Provider Note       PCP: No primary care provider on file.   Age: 71 y.o.   Sex: female     DISPOSITION Decision To Discharge 02/13/2022 03:57:41 PM       ICD-10-CM    1. Arthritis of right hip  M16.11 [facility] [employer], [facility]          Medical Decision Making     Complexity of Problems Addressed:  1 or more acute illnesses that pose a threat to life or bodily function.     Data Reviewed and Analyzed:  I independently ordered and reviewed each unique test.  I reviewed external records: provider visit note from PCP.           Discussion of management or test interpretation.      Patient is a 71 year old female who presents with right groin pain for the last 5 days.  Patient has had intermittent pain in the right groin for the last month or 2.  Patient does have a history of arthritis.  Patient denies any trauma.  Patient denies any lower extremity edema denies any calf pain has no history of DVTs or PEs.  Patient denies injury.  Patient states the pain is more with movement.  Patient has not had a bulge in her groin no history of inguinal hernias.  Differential diagnosis includes but is not limited to right hip arthritis, right groin strain, right hip fracture.  My independent interpretation of patient's right hip x-ray and pelvis is negative for an acute fracture.  Patient either has a right groin strain or right hip arthritis and we will treat with anti-inflammatories and refer patient to Ortho.  Patient has no weakness or numbness of her lower extremities and has no bowel or bladder dysfunction no swelling of her right leg no evidence of DVT.  Patient has no evidence of inguinal hernia on exam.  All questions answered.      Risk of Complications and/or Morbidity of Patient Management:  Prescription drug management performed.  Shared medical decision making was utilized in creating the patients health plan today.         History       Patient is a  71 year old female who presents with right groin pain for the last 5 days.  Patient has had intermittent pain in the right groin for the last month or 2.  Patient does have a history of arthritis.  Patient denies any trauma.  Patient denies any lower extremity edema denies any calf pain has no history of DVTs or PEs.  Patient denies injury.  Patient states the pain is more with movement.  Patient has not had a bulge in her groin no history of inguinal hernias.         Review of Systems   Genitourinary:         Right groin pain     All other systems reviewed and are negative.      Physical Exam     Vitals signs and nursing note reviewed:  Vitals:    02/13/22 1345 02/13/22 1640   BP: 127/80 (!) 145/98   Pulse: 88 84   Resp: 18 16   Temp: 97.7 F (36.5 C) 98.4 F (36.9 C)   TempSrc: Oral Oral   SpO2: 99% 99%   Weight: 44.5 kg (98 lb)    Height: 1.6 m (5\' 3" )       Physical Exam  Vitals and  nursing note reviewed.   Constitutional:       Appearance: Normal appearance.   HENT:      Head: Normocephalic and atraumatic.      Nose: Nose normal.      Mouth/Throat:      Mouth: Mucous membranes are moist.      Pharynx: Oropharynx is clear.   Eyes:      Extraocular Movements: Extraocular movements intact.      Conjunctiva/sclera: Conjunctivae normal.      Pupils: Pupils are equal, round, and reactive to light.   Cardiovascular:      Rate and Rhythm: Normal rate.      Pulses: Normal pulses.   Pulmonary:      Effort: Pulmonary effort is normal.   Abdominal:      General: Abdomen is flat. Bowel sounds are normal.      Palpations: Abdomen is soft.   Musculoskeletal:         General: Normal range of motion.      Cervical back: Normal range of motion and neck supple.   Skin:     General: Skin is warm.      Capillary Refill: Capillary refill takes less than 2 seconds.   Neurological:      General: No focal deficit present.      Mental Status: She is alert and oriented to person, place, and time. Mental status is at baseline.    Psychiatric:         Mood and Affect: Mood normal.         Behavior: Behavior normal.         Thought Content: Thought content normal.         Judgment: Judgment normal.          Procedures     Procedures    Orders Placed This Encounter   Procedures    XR HIP 2-3 VW W PELVIS RIGHT    [place]        Medications given during this emergency department visit:  Medications   naproxen (NAPROSYN) tablet 500 mg (500 mg Oral Given 02/13/22 1526)       Discharge Medication List as of 02/13/2022  4:33 PM        START taking these medications    Details   ketorolac (TORADOL) 10 MG tablet Take 1 tablet by mouth every 6 hours as needed for Pain, Disp-20 tablet, R-0Print              No past medical history on file.     Past Surgical History:   Procedure Laterality Date    XR MIDLINE EQUAL OR GREATER THAN 5 YEARS  04/18/2012    XR MIDLINE EQUAL OR GREATER THAN 5 YEARS 04/18/2012        Social History     Socioeconomic History    Marital status: Married        Discharge Medication List as of 02/13/2022  4:33 PM           Results for orders placed or performed during the hospital encounter of 02/13/22   XR HIP 2-3 VW W PELVIS RIGHT    Narrative    Right Hip    INDICATION: Right groin pain and low back pain.    Three views of the right hip were obtained    FINDINGS: Degenerative changes at the right hip joint, right lower lumbar spine  and right right sacroiliac joint.  No obvious acute bony fracture or joint dislocations. No bony abnormality is  seen in the proximal right femur or adjacent pelvis.      Impression    Degenerative changes at the right hip joint, right lower lumbar  spine and right right sacroiliac joint.    No obvious acute bony fracture or joint dislocations.           XR HIP 2-3 VW W PELVIS RIGHT   Final Result   Degenerative changes at the right hip joint, right lower lumbar   spine and right right sacroiliac joint.      No obvious acute bony fracture or joint  dislocations.                            Voice dictation software was used during the making of this note.  This software is not perfect and grammatical and other typographical errors may be present.  This note has not been completely proofread for errors.       [address] [place],   02/13/22 [phone]

## 2022-02-13 NOTE — Discharge Instructions (Signed)
Continuity of Care Form    Patient Name: Heather Waters   DOB:  09-Jul-1950  MRN:  [id number]    Admit date:  02/13/2022  Discharge date:  ***    Code Status Order: No Order   Advance Directives:     Admitting Physician:  No admitting provider for patient encounter.  PCP: No primary care provider on file.    Discharging Nurse: [facility] Unit/Room#: D08/D08  Discharging Unit Phone Number: ***    Emergency Contact:   Extended Emergency Contact Information  Primary Emergency Contact: [name],[name]  Home Phone: [phone]  Relation: Spouse  Interpreter needed? No    Past Surgical History:  Past Surgical History:   Procedure Laterality Date    XR MIDLINE EQUAL OR GREATER THAN 5 YEARS  04/18/2012    XR MIDLINE EQUAL OR GREATER THAN 5 YEARS 04/18/2012       Immunization History:     There is no immunization history on file for this patient.    Active Problems:  There is no problem list on file for this patient.      Isolation/Infection:   Isolation            No Isolation           Unreconciled Outside Infections       External data in this report might not trigger clinical decision support.    .      Infection Onset Last Indicated Last Received Source    C-diff Rule Out 12/05/17 12/05/17  12/05/17 [facility]          Patient Infection Status       None to display            Nurse Assessment:  Last Vital Signs: BP 127/80   Pulse 88   Temp 97.7 F (36.5 C) (Oral)   Resp 18   Ht 1.6 m (5\' 3" )   Wt 44.5 kg (98 lb)   SpO2 99%   BMI 17.36 kg/m     Last documented pain score (0-10 scale): Pain Level: 7  Last Weight:   Wt Readings from Last 1 Encounters:   02/13/22 44.5 kg (98 lb)     Mental Status:  {IP PT MENTAL STATUS:20030}    IV Access:  {MH COC IV ACCESS:304088262}    Nursing Mobility/ADLs:  Walking   {CHP DME 02/15/22  Transfer  {CHP DME SMOL:078675449}  Bathing  {CHP DME EEFE:071219758}  Dressing  {CHP DME ITGP:498264158}  Toileting  {CHP DME XENM:076808811}  Feeding  {CHP DME  SRPR:945859292}  Med Admin  {CHP DME KMQK:863817711}  Med Delivery   {MH COC MED Delivery:304088264}    Wound Care Documentation and Therapy:        Elimination:  Continence:   Bowel: {YES / AFBX:038333832}  Bladder: {YES / NV:91660}  Urinary Catheter: {Urinary Catheter:304088013}   Colostomy/Ileostomy/Ileal Conduit: {YES / AY:04599}       Date of Last BM: ***  No intake or output data in the 24 hours ending 02/13/22 1638  No intake/output data recorded.    Safety Concerns:     {MH COC Safety Concerns:304088272}    Impairments/Disabilities:      {MH COC Impairments/Disabilities:304088273}    Nutrition Therapy:  Current Nutrition Therapy:   {MH COC Diet List:304088271}    Routes of Feeding: {CHP DME Other Feedings:304088042}  Liquids: {Slp liquid thickness:30034}  Daily Fluid Restriction: {CHP DME Yes amt example:304088041}  Last Modified Barium Swallow with Video (Video Swallowing  Test): {Done Not Done OEUM:353614431}    Treatments at the Time of Hospital Discharge:   Respiratory Treatments: ***  Oxygen Therapy:  {Therapy; copd oxygen:17808}  Ventilator:    {MH CC Vent VQMG:867619509}    Rehab Therapies: {THERAPEUTIC INTERVENTION:[phone]}  Weight Bearing Status/Restrictions: {MH CC Weight Bearing:304508812}  Other Medical Equipment (for information only, NOT a DME order):  {EQUIPMENT:304520077}  Other Treatments: ***    Patient's personal belongings (please select all that are sent with patient):  {CHP DME Belongings:304088044}    RN SIGNATURE:  {Esignature:304088025}    CASE MANAGEMENT/SOCIAL WORK SECTION    Inpatient Status Date: ***    Readmission Risk Assessment Score:  Readmission Risk              Risk of Unplanned Readmission:  0           Discharging to Facility/ Agency   Name:   Address:  Phone:  Fax:    Dialysis Facility (if applicable)   Name:  Address:  Dialysis Schedule:  Phone:  Fax:    Case Manager/Social Worker signature: {Esignature:304088025}    PHYSICIAN SECTION    Prognosis:  {Prognosis:915-426-1425}    Condition at Discharge: [place] Patient Condition:304088024}    Rehab Potential (if transferring to Rehab): {Prognosis:915-426-1425}    Recommended Labs or Other Treatments After Discharge: ***    Physician Certification: I certify the above information and transfer of Heather Waters  is necessary for the continuing treatment of the diagnosis listed and that she requires {Admit to Appropriate Level of Care:20763} for {GREATER/LESS:304500278} 30 days.     Update Admission H&P: {CHP DME Changes in TOIZT:245809983}    PHYSICIAN SIGNATURE:  {Esignature:304088025}

## 2022-02-13 NOTE — ED Triage Notes (Signed)
Patient ambulatory to triage with cane and CO right groin pain x 5 days. Reports some low back pain. Recent long drive. Denies injury

## 2022-02-13 NOTE — ED Notes (Signed)
I have reviewed discharge instructions with the patient and spouse.  The patient and spouse verbalized understanding.    Patient left ED via Discharge Method: wheelchair to Home.    Opportunity for questions and clarification provided.       Patient given 1 scripts.         To continue your aftercare when you leave the hospital, you may receive an automated call from our care team to check in on how you are doing.  This is a free service and part of our promise to provide the best care and service to meet your aftercare needs." If you have questions, or wish to unsubscribe from this service please call [phone].  Thank you for Choosing our [facility] Emergency Department.        [employer], RN  02/13/22 [phone]
